# Patient Record
Sex: Female | Born: 1965 | Race: Black or African American | Hispanic: No | Marital: Married | Smoking: Never smoker
Health system: Southern US, Community
[De-identification: ages and names within clinical notes are randomized; demographics above are authoritative.]

## PROBLEM LIST (undated history)

## (undated) DIAGNOSIS — R0981 Nasal congestion: Secondary | ICD-10-CM

## (undated) DIAGNOSIS — K921 Melena: Secondary | ICD-10-CM

## (undated) DIAGNOSIS — D649 Anemia, unspecified: Secondary | ICD-10-CM

## (undated) DIAGNOSIS — R238 Other skin changes: Secondary | ICD-10-CM

## (undated) DIAGNOSIS — L272 Dermatitis due to ingested food: Secondary | ICD-10-CM

## (undated) DIAGNOSIS — T7840XA Allergy, unspecified, initial encounter: Secondary | ICD-10-CM

## (undated) DIAGNOSIS — K219 Gastro-esophageal reflux disease without esophagitis: Secondary | ICD-10-CM

## (undated) DIAGNOSIS — G43909 Migraine, unspecified, not intractable, without status migrainosus: Secondary | ICD-10-CM

## (undated) DIAGNOSIS — E7800 Pure hypercholesterolemia, unspecified: Secondary | ICD-10-CM

## (undated) DIAGNOSIS — R233 Spontaneous ecchymoses: Secondary | ICD-10-CM

## (undated) DIAGNOSIS — K625 Hemorrhage of anus and rectum: Secondary | ICD-10-CM

## (undated) DIAGNOSIS — K5900 Constipation, unspecified: Secondary | ICD-10-CM

## (undated) HISTORY — DX: Anemia, unspecified: D64.9

## (undated) HISTORY — DX: Dermatitis due to ingested food: L27.2

## (undated) HISTORY — DX: Nasal congestion: R09.81

## (undated) HISTORY — DX: Spontaneous ecchymoses: R23.3

## (undated) HISTORY — PX: EYE SURGERY: SHX253

## (undated) HISTORY — DX: Hemorrhage of anus and rectum: K62.5

## (undated) HISTORY — DX: Constipation, unspecified: K59.00

## (undated) HISTORY — DX: Migraine, unspecified, not intractable, without status migrainosus: G43.909

## (undated) HISTORY — DX: Pure hypercholesterolemia, unspecified: E78.00

## (undated) HISTORY — DX: Other skin changes: R23.8

## (undated) HISTORY — DX: Allergy, unspecified, initial encounter: T78.40XA

## (undated) HISTORY — DX: Melena: K92.1

---

## 2004-07-06 ENCOUNTER — Emergency Department (HOSPITAL_COMMUNITY): Admission: EM | Admit: 2004-07-06 | Discharge: 2004-07-06 | Payer: Self-pay | Admitting: Emergency Medicine

## 2005-12-08 ENCOUNTER — Encounter: Admission: RE | Admit: 2005-12-08 | Discharge: 2005-12-08 | Payer: Self-pay | Admitting: Obstetrics & Gynecology

## 2005-12-18 ENCOUNTER — Encounter: Admission: RE | Admit: 2005-12-18 | Discharge: 2005-12-18 | Payer: Self-pay | Admitting: Obstetrics & Gynecology

## 2006-01-02 ENCOUNTER — Encounter: Admission: RE | Admit: 2006-01-02 | Discharge: 2006-01-02 | Payer: Self-pay | Admitting: Gastroenterology

## 2006-06-25 ENCOUNTER — Encounter: Admission: RE | Admit: 2006-06-25 | Discharge: 2006-06-25 | Payer: Self-pay | Admitting: Obstetrics & Gynecology

## 2006-11-19 ENCOUNTER — Encounter: Admission: RE | Admit: 2006-11-19 | Discharge: 2006-11-19 | Payer: Self-pay

## 2007-01-24 ENCOUNTER — Encounter: Admission: RE | Admit: 2007-01-24 | Discharge: 2007-01-24 | Payer: Self-pay | Admitting: Obstetrics & Gynecology

## 2007-03-10 IMAGING — MG MM MAMMO SCREENING
4 series · 4 of 4 positions shown · non-contrast
Comparison: none

SCREENING MAMMOGRAM:
There is a fibroglandular pattern.  A possible mass is noted in the left breast.  Spot compression 
views and possibly sonography are recommended for further evaluation.  In the right breast, no 
masses or malignant type calcifications are identified.

[R CC]
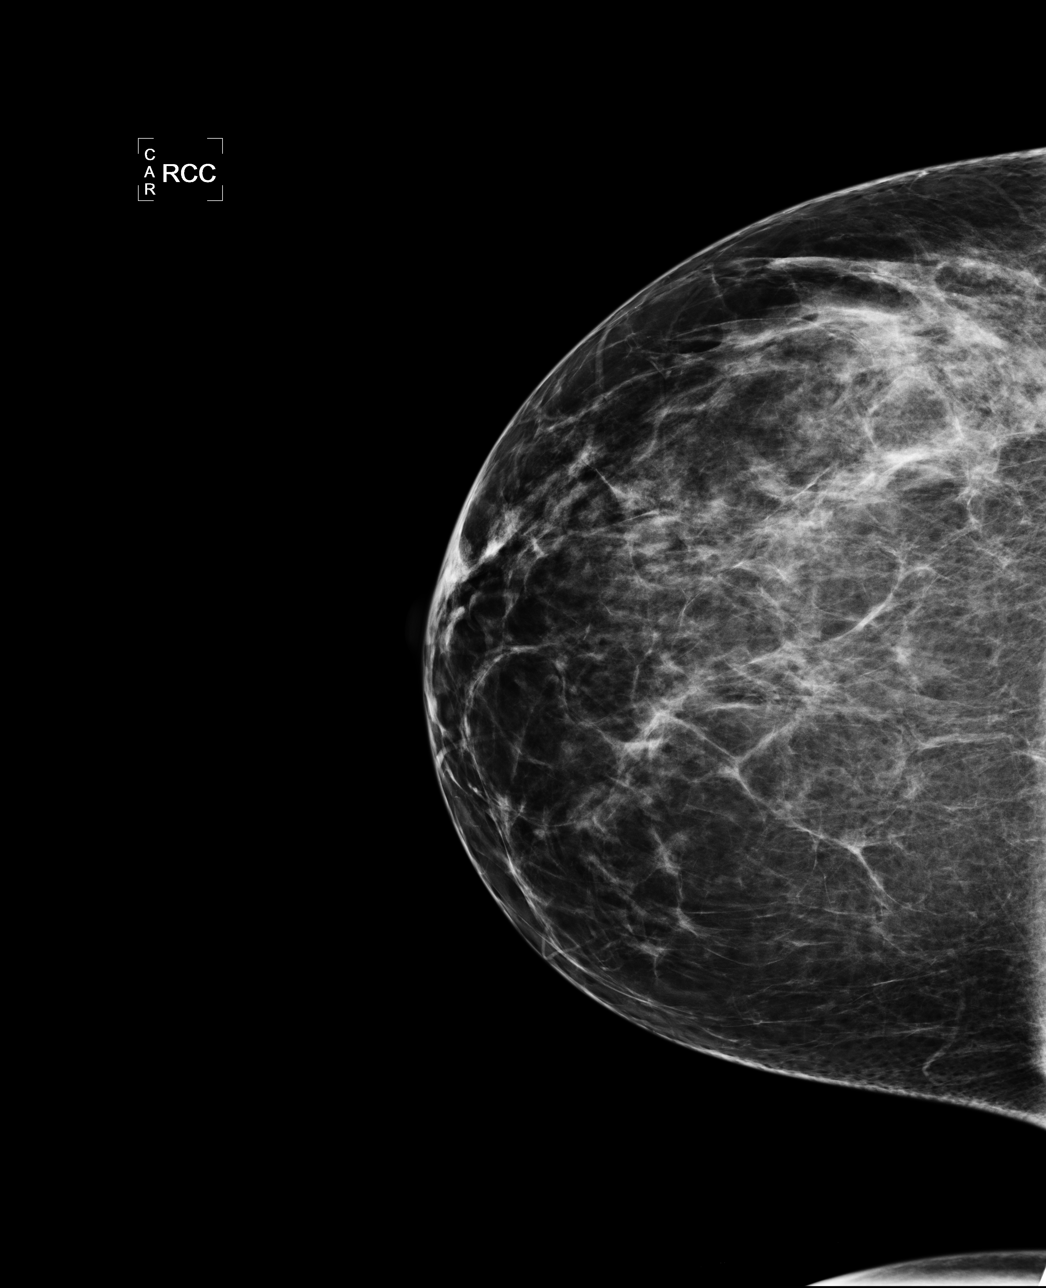

[L CC]
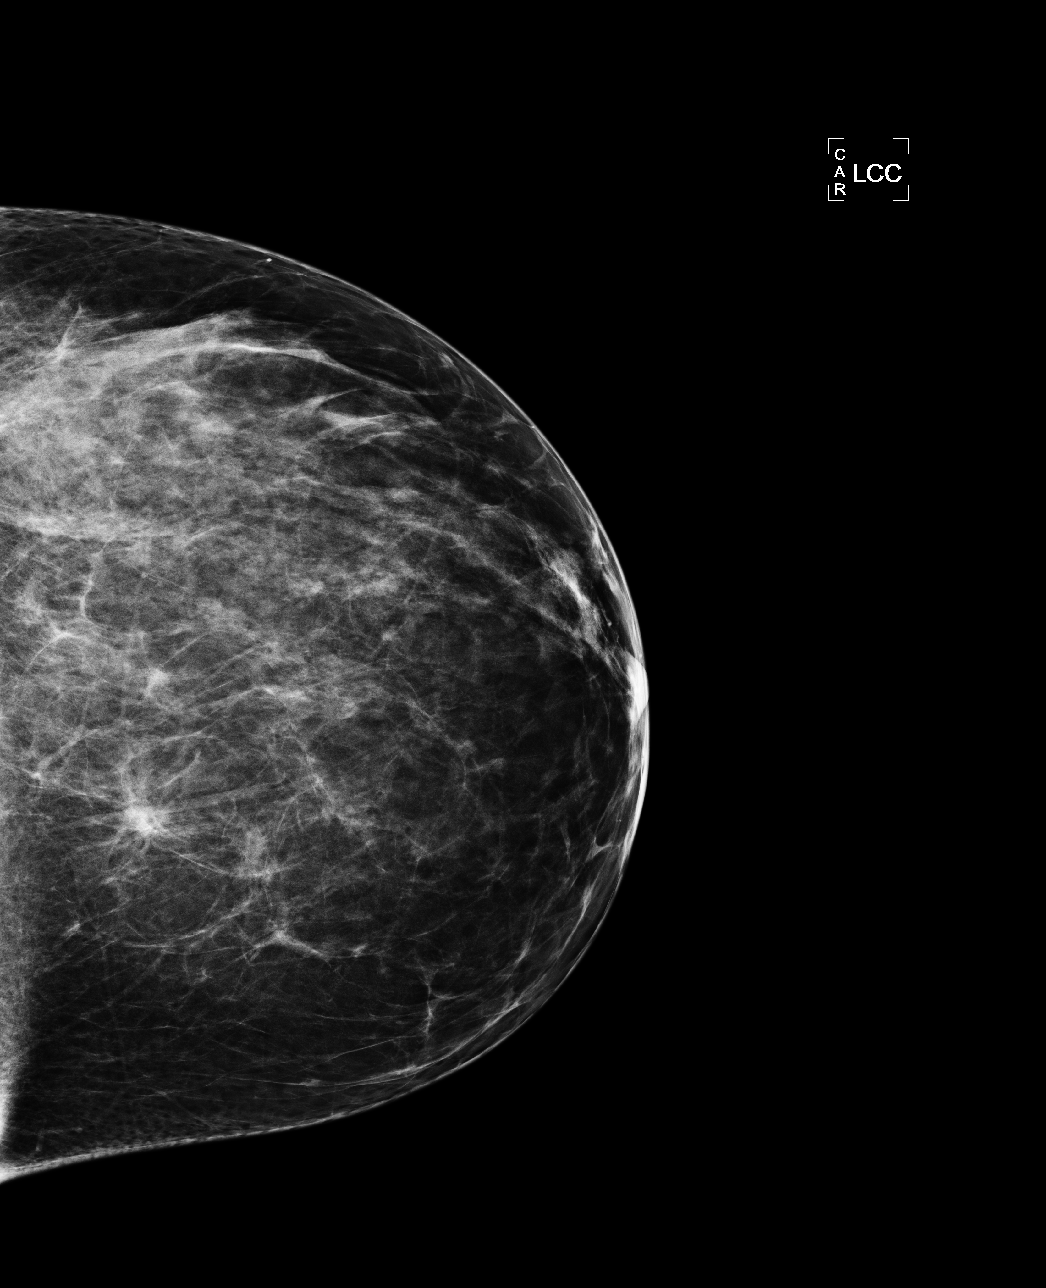

[L MLO]
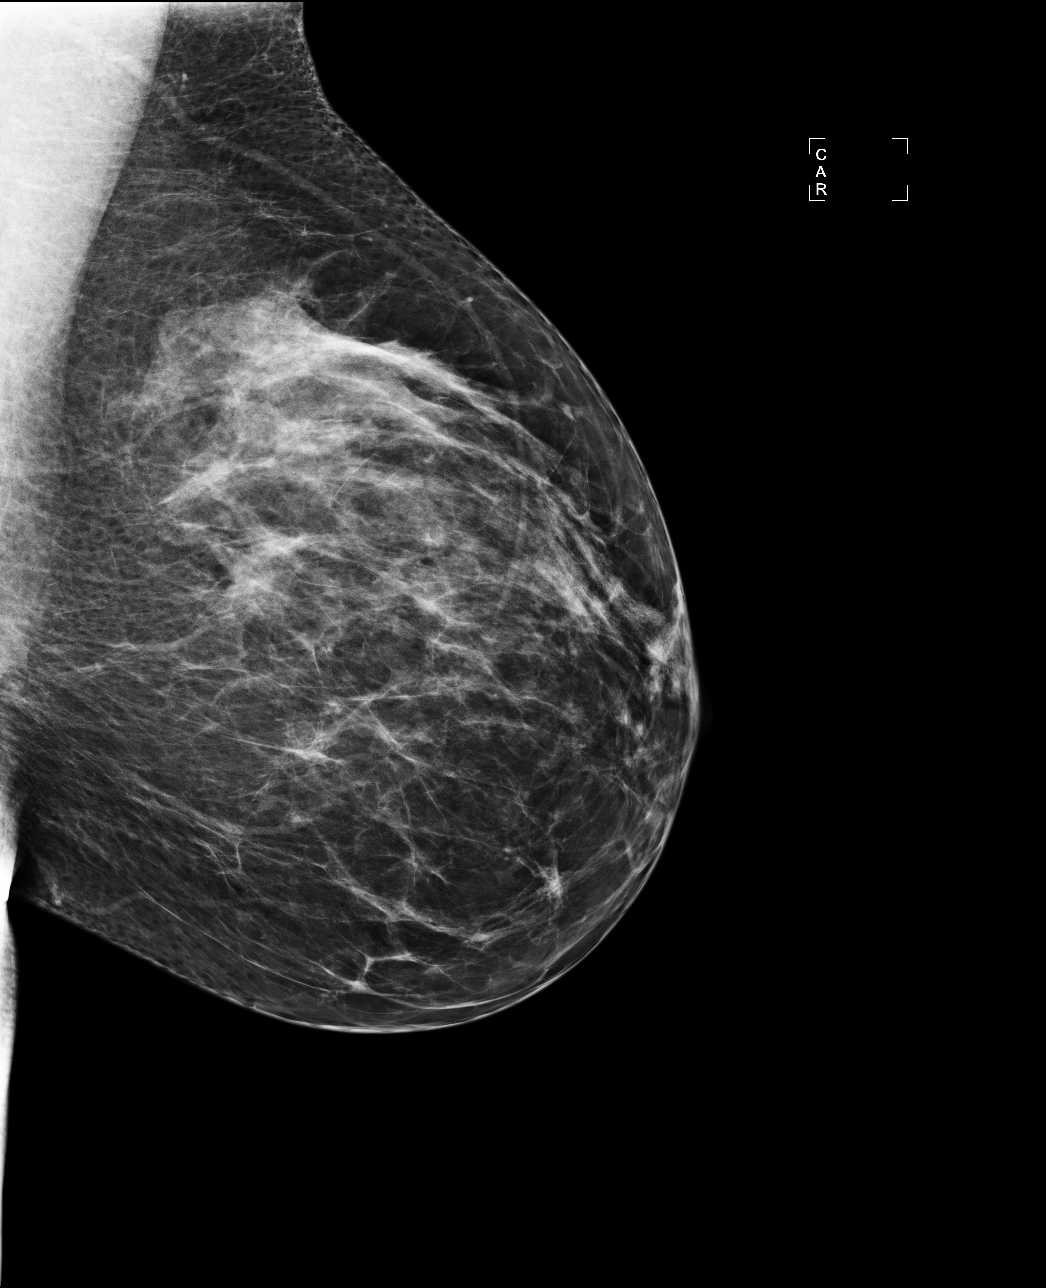

[R MLO]
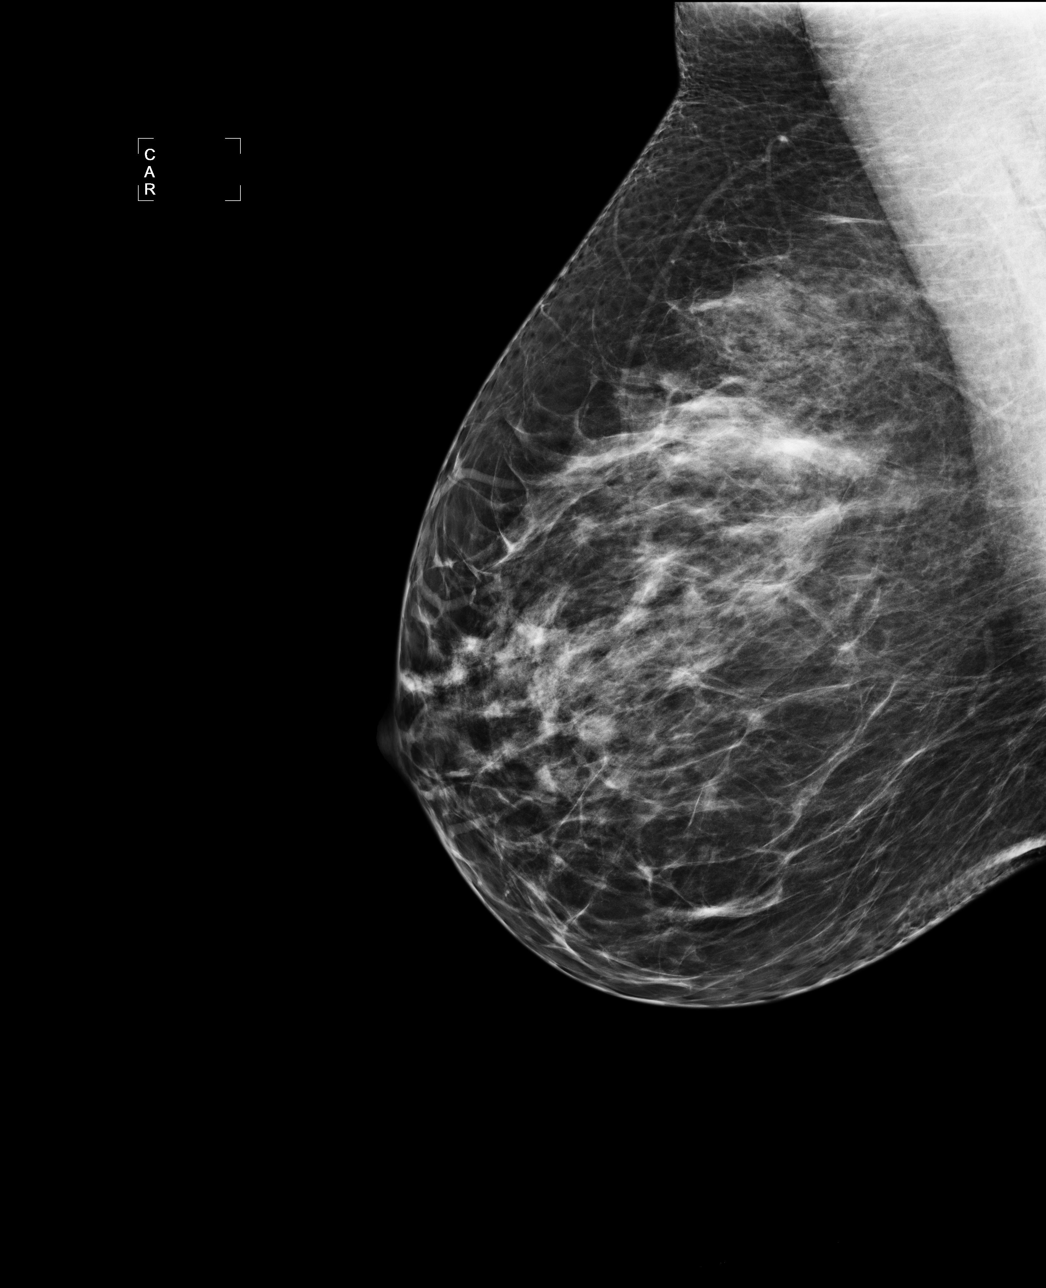

[4 of 4 positions shown; findings below may reference images not displayed]

IMPRESSION: Possible mass, left breast.  Additional evaluation is indicated.  The patient will be contacted for
additional studies and a supplementary report will follow.  No specific mammographic evidence of 
malignancy, right breast.

ASSESSMENT: Need additional imaging evaluation and/or prior mammograms for comparison - BI-RADS 0

Further imaging of the left breast.

## 2007-10-19 ENCOUNTER — Ambulatory Visit: Payer: Self-pay | Admitting: Pulmonary Disease

## 2007-10-19 LAB — CONVERTED CEMR LAB
ALT: 15 units/L (ref 0–35)
AST: 24 units/L (ref 0–37)
Albumin: 3.8 g/dL (ref 3.5–5.2)
Basophils Absolute: 0 10*3/uL (ref 0.0–0.1)
Basophils Relative: 0 % (ref 0.0–1.0)
Bilirubin Urine: NEGATIVE
Calcium: 9.1 mg/dL (ref 8.4–10.5)
Chloride: 108 meq/L (ref 96–112)
Cholesterol: 206 mg/dL (ref 0–200)
Crystals: NEGATIVE
Eosinophils Absolute: 0.2 10*3/uL (ref 0.0–0.6)
GFR calc Af Amer: 102 mL/min
GFR calc non Af Amer: 84 mL/min
HCT: 33.2 % — ABNORMAL LOW (ref 36.0–46.0)
Iron: 69 ug/dL (ref 42–145)
Ketones, ur: NEGATIVE mg/dL
Monocytes Absolute: 0.7 10*3/uL (ref 0.2–0.7)
Monocytes Relative: 7.9 % (ref 3.0–11.0)
Neutrophils Relative %: 62.5 % (ref 43.0–77.0)
Nitrite: NEGATIVE
Potassium: 3.6 meq/L (ref 3.5–5.1)
RDW: 13.9 % (ref 11.5–14.6)
Sodium: 140 meq/L (ref 135–145)
Specific Gravity, Urine: >=1.03 (ref 1.000–1.03)
Total CHOL/HDL Ratio: 4.4
Total Protein, Urine: NEGATIVE mg/dL
Triglycerides: 71 mg/dL (ref 0–149)
Urine Glucose: NEGATIVE mg/dL
VLDL: 14 mg/dL (ref 0–40)

## 2007-10-27 ENCOUNTER — Encounter: Payer: Self-pay | Admitting: Pulmonary Disease

## 2007-11-02 ENCOUNTER — Ambulatory Visit: Payer: Self-pay | Admitting: Pulmonary Disease

## 2007-11-02 LAB — CONVERTED CEMR LAB
OCCULT 1: NEGATIVE
OCCULT 2: NEGATIVE
OCCULT 3: NEGATIVE

## 2008-01-13 ENCOUNTER — Telehealth (INDEPENDENT_AMBULATORY_CARE_PROVIDER_SITE_OTHER): Payer: Self-pay | Admitting: *Deleted

## 2008-02-16 ENCOUNTER — Encounter: Admission: RE | Admit: 2008-02-16 | Discharge: 2008-02-16 | Payer: Self-pay | Admitting: Obstetrics and Gynecology

## 2008-06-01 ENCOUNTER — Ambulatory Visit: Payer: Self-pay | Admitting: Internal Medicine

## 2008-06-01 ENCOUNTER — Telehealth: Payer: Self-pay | Admitting: Adult Health

## 2008-06-01 ENCOUNTER — Ambulatory Visit: Payer: Self-pay | Admitting: Pulmonary Disease

## 2008-06-01 DIAGNOSIS — D649 Anemia, unspecified: Secondary | ICD-10-CM | POA: Insufficient documentation

## 2008-06-01 DIAGNOSIS — G43909 Migraine, unspecified, not intractable, without status migrainosus: Secondary | ICD-10-CM

## 2008-08-30 ENCOUNTER — Ambulatory Visit: Payer: Self-pay | Admitting: Pulmonary Disease

## 2008-09-17 ENCOUNTER — Ambulatory Visit: Payer: Self-pay | Admitting: Pulmonary Disease

## 2008-11-21 ENCOUNTER — Ambulatory Visit: Payer: Self-pay | Admitting: Pulmonary Disease

## 2008-11-21 DIAGNOSIS — E7800 Pure hypercholesterolemia, unspecified: Secondary | ICD-10-CM

## 2009-01-02 ENCOUNTER — Ambulatory Visit: Payer: Self-pay | Admitting: Pulmonary Disease

## 2009-01-02 LAB — CONVERTED CEMR LAB
Fecal Occult Blood: NEGATIVE
OCCULT 2: NEGATIVE
OCCULT 4: NEGATIVE
OCCULT 5: NEGATIVE

## 2009-02-20 ENCOUNTER — Encounter: Admission: RE | Admit: 2009-02-20 | Discharge: 2009-02-20 | Payer: Self-pay | Admitting: Pulmonary Disease

## 2009-04-23 HISTORY — PX: MYOMECTOMY: SHX85

## 2009-05-14 ENCOUNTER — Encounter (INDEPENDENT_AMBULATORY_CARE_PROVIDER_SITE_OTHER): Payer: Self-pay | Admitting: Obstetrics & Gynecology

## 2009-05-14 ENCOUNTER — Ambulatory Visit (HOSPITAL_COMMUNITY): Admission: RE | Admit: 2009-05-14 | Discharge: 2009-05-15 | Payer: Self-pay | Admitting: Obstetrics & Gynecology

## 2009-12-25 ENCOUNTER — Telehealth: Payer: Self-pay | Admitting: Pulmonary Disease

## 2010-01-17 ENCOUNTER — Ambulatory Visit: Payer: Self-pay | Admitting: Pulmonary Disease

## 2010-01-17 LAB — CONVERTED CEMR LAB
ALT: 29 units/L (ref 0–35)
Albumin: 3.7 g/dL (ref 3.5–5.2)
Basophils Relative: 0.3 % (ref 0.0–3.0)
Bilirubin, Direct: 0.1 mg/dL (ref 0.0–0.3)
Chloride: 105 meq/L (ref 96–112)
Glucose, Bld: 89 mg/dL (ref 70–99)
HCT: 34.7 % — ABNORMAL LOW (ref 36.0–46.0)
Ketones, ur: NEGATIVE mg/dL
Lymphocytes Relative: 21.9 % (ref 12.0–46.0)
MCHC: 32.8 g/dL (ref 30.0–36.0)
MCV: 90.5 fL (ref 78.0–100.0)
Monocytes Relative: 9.5 % (ref 3.0–12.0)
Neutrophils Relative %: 67.2 % (ref 43.0–77.0)
Platelets: 230 10*3/uL (ref 150.0–400.0)
Potassium: 4.2 meq/L (ref 3.5–5.1)
RDW: 12.7 % (ref 11.5–14.6)
Saturation Ratios: 11 % — ABNORMAL LOW (ref 20.0–50.0)
Total Bilirubin: 0.5 mg/dL (ref 0.3–1.2)
Total CHOL/HDL Ratio: 3
Total Protein, Urine: NEGATIVE mg/dL
Total Protein: 7.2 g/dL (ref 6.0–8.3)
Urine Glucose: NEGATIVE mg/dL
Urobilinogen, UA: 1 (ref 0.0–1.0)
WBC: 8.6 10*3/uL (ref 4.5–10.5)
pH: 6 (ref 5.0–8.0)

## 2010-02-24 ENCOUNTER — Encounter: Admission: RE | Admit: 2010-02-24 | Discharge: 2010-02-24 | Payer: Self-pay | Admitting: Pulmonary Disease

## 2010-12-05 DIAGNOSIS — L272 Dermatitis due to ingested food: Secondary | ICD-10-CM | POA: Insufficient documentation

## 2010-12-21 LAB — CONVERTED CEMR LAB
AST: 19 units/L (ref 0–37)
Alkaline Phosphatase: 45 units/L (ref 39–117)
BUN: 9 mg/dL (ref 6–23)
Basophils Relative: 0.7 % (ref 0.0–3.0)
Bilirubin, Direct: 0.1 mg/dL (ref 0.0–0.3)
CO2: 28 meq/L (ref 19–32)
Calcium: 9 mg/dL (ref 8.4–10.5)
Chloride: 105 meq/L (ref 96–112)
Creatinine, Ser: 0.8 mg/dL (ref 0.4–1.2)
Eosinophils Relative: 1.1 % (ref 0.0–5.0)
GFR calc non Af Amer: 84 mL/min
Glucose, Bld: 88 mg/dL (ref 70–99)
HCT: 35.4 % — ABNORMAL LOW (ref 36.0–46.0)
Hemoglobin: 11.9 g/dL — ABNORMAL LOW (ref 12.0–15.0)
Iron: 93 ug/dL (ref 42–145)
Leukocytes, UA: NEGATIVE
Lymphocytes Relative: 27.9 % (ref 12.0–46.0)
MCHC: 33.6 g/dL (ref 30.0–36.0)
MCV: 89.6 fL (ref 78.0–100.0)
RBC / HPF: NONE SEEN
RBC: 3.95 M/uL (ref 3.87–5.11)
Saturation Ratios: 21.3 % (ref 20.0–50.0)
Specific Gravity, Urine: 1.015 (ref 1.000–1.03)
Total Bilirubin: 0.7 mg/dL (ref 0.3–1.2)
Total CHOL/HDL Ratio: 4.2
Total Protein, Urine: NEGATIVE mg/dL
Total Protein: 6.9 g/dL (ref 6.0–8.3)
Urine Glucose: NEGATIVE mg/dL
Urobilinogen, UA: 0.2 (ref 0.0–1.0)
WBC: 6.7 10*3/uL (ref 4.5–10.5)
pH: 7 (ref 5.0–8.0)

## 2010-12-25 NOTE — Assessment & Plan Note (Signed)
Summary: cpx/jd   CC:  Yearly CPX....  History of Present Illness: 45 y/o WF here for follow up visit and CPX... she is [place] Cherry's daughter & Arlee Muslim (passed away 2009-04-29) granddaughter... she has had a good year overall w/ GYN surg by [facility] (uterine myomectomy)... still notes heavy menses, and is taking the Fe & VitC... she was rec to start Pravastatin last yr but she decided to "handle it on my own w/ diet"... her weight is unchanged over the past yr, but she pomises to do better & start exerc at the gym... She notes a recent URI w/ ST, hoarseness, some congestion... feeling better now but notes some eye irritation... **advised rest, fluids, mucinex, natural tears vs visine.    Current Problems:   PHYSICAL EXAMINATION (ICD-V70.0) - age 66> GYN= DrLavoie;  no GI problems noted;  didn't get the 2009-04-29 flu vaccine...  HYPERCHOLESTEROLEMIA (ICD-272.0) - on diet alone...  ~  FLP 11/08 showed TChol 206, TG 71, HDL 47, LDL 151... rec- diet therapy, may need meds.  ~  FLP 12/09 showed TChol 248, TG 115, HDL 59, LDL 163... rec to start Prav40 but she never did.  ~  FLP 2/11 showed TChol 196, TG 39, HDL 68, LDl 120... continue diet efforts.  MIGRAINE HEADACHE (ICD-346.90) - she uses OTC Tylenol and Advil as needed.  ANEMIA (ICD-285.9) - she has a hx of iron-defic anemia the result of heavy menses.Marland Kitchen. ?fibroid- [facility]... she takes FESO4 and VitC daily... she is on BCP's from [facility]ous[facility] and she tells me that she is sched for fibroid surgery next summer... s/p uterine myomectomy 6/10 by DrLavoie...  ~  labs 2/11 showed Hg= 11.4, MCV= 91, Fe= 36 (11%sat)... continue Fe & Vit C daily...   Allergies (verified): No Known Drug Allergies  Comments:  Nurse/Medical Assistant: The patient's medications and allergies were reviewed with the patient and were updated in the Medication and Allergy Lists.  Past History:  Past Medical History: HYPERCHOLESTEROLEMIA  (ICD-272.0) MIGRAINE HEADACHE (ICD-346.90) ANEMIA (ICD-285.9)  Past Surgical History: S/P uterine myomectomy 6/10 by DrLavoie  Family History: Reviewed history from 11/21/2008 and no changes required. Father died in his 59's from a stroke Mother, [place], is in her 22's w/ hx breast cancer 2 Siblings-  Brother died from suicide Sister alive w/ hx migraines  Social History: Reviewed history from 06/01/2008 and no changes required. Married, husb= [place], 71yrs. 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  The patient denies fever, chills, sweats, anorexia, fatigue, weakness, malaise, weight loss, sleep disorder, blurring, diplopia, eye discharge, vision loss, eye pain, photophobia, earache, ear discharge, tinnitus, decreased hearing, nasal congestion, nosebleeds, hoarseness, chest pain, palpitations, syncope, dyspnea on exertion, orthopnea, PND, peripheral edema, dyspnea at rest, excessive sputum, hemoptysis, wheezing, pleurisy, nausea, vomiting, diarrhea, constipation, change in bowel habits, abdominal pain, melena, hematochezia, jaundice, gas/bloating, indigestion/heartburn, dysphagia, odynophagia, dysuria, hematuria, urinary frequency, urinary hesitancy, nocturia, incontinence, back pain, joint pain, joint swelling, muscle cramps, muscle weakness, stiffness, arthritis, sciatica, restless legs, leg pain at night, leg pain with exertion, rash, itching, dryness, suspicious lesions, paralysis, paresthesias, seizures, tremors, vertigo, transient blindness, frequent falls, frequent headaches, difficulty walking, depression, anxiety, memory loss, confusion, cold intolerance, heat intolerance, polydipsia, polyphagia, polyuria, unusual weight change, abnormal bruising, bleeding, enlarged lymph nodes, urticaria, allergic rash, hay fever, and recurrent infections.    Vital  Signs:  Patient profile:   45 year old female Height:      64.5 inches Weight:      178.50 pounds BMI:     30.28 O2 Sat:      100 % on Room air Temp:     98.4 degrees F oral Pulse rate:   66 / minute BP sitting:   112 / 78  (right arm) Cuff size:   regular  Vitals Entered By: Randell Loop CMA (January 17, 2010 9:12 AM)  O2 Sat at Rest %:  100 O2 Flow:  Room air CC: Yearly CPX... Is Patient Diabetic? No Pain Assessment Patient in pain? no      Comments meds updated today   Physical Exam  Additional Exam:  WD, petite, 45 y/o BF in NAD... GENERAL:  Alert & oriented; pleasant & cooperative... HEENT:  [place]/AT, EOM-wnl, PERRLA, EACs-clear, TMs-wnl, NOSE-clear, THROAT-clear & wnl. NECK:  Supple w/ full ROM; no JVD; normal carotid impulses w/o bruits; no thyromegaly or nodules palpated; no lymphadenopathy. CHEST:  Clear to P & A; without wheezes/ rales/ or rhonchi. HEART:  Regular Rhythm; without murmurs/ rubs/ or gallops. ABDOMEN:  Soft & nontender; normal bowel sounds; no organomegaly or masses detected. EXT: without deformities or arthritic changes; no varicose veins/ venous insuffic/ or edema. NEURO:  CN's intact; motor testing normal; sensory testing normal; gait normal & balance OK. DERM:  No lesions noted; no rash etc...    EKG  Procedure date:  01/17/2010  Findings:      Normal sinus rhythm with rate of:  62/min... Tracing is WNL, NAD...  SN   MISC. Report  Procedure date:  01/17/2010  Findings:      BMP (METABOL)   Sodium                    139 mEq/L                   135-145   Potassium                 4.2 mEq/L                   3.5-5.1   Chloride                  105 mEq/L                   96-112   Carbon Dioxide            29 mEq/L                    19-32   Glucose                   89 mg/dL                    11-91   BUN                       10 mg/dL                    4-78   Creatinine                0.8 mg/dL                   2.9-5.6   Calcium  9.1 mg/dL                   5.6-21.3   GFR                       100.88 mL/min               >60  Lipid Panel (LIPID)   Cholesterol               196 mg/dL                   0-865   Triglycerides             39.0 mg/dL                  7.8-469.6   HDL                       29.52 mg/dL                 >84.13   LDL Cholesterol      [H]  244 mg/dL                   0-10  CBC Platelet w/Diff (CBCD)   White Cell Count          8.6 K/uL                    4.5-10.5   Red Cell Count       [L]  3.83 Mil/uL                 3.87-5.11   Hemoglobin           [L]  11.4 g/dL                   27.2-53.6   Hematocrit           [L]  34.7 %                      36.0-46.0   MCV                       90.5 fl                     78.0-100.0   Platelet Count            230.0 K/uL                  150.0-400.0   Neutrophil %              67.2 %                      43.0-77.0   Lymphocyte %              21.9 %                      12.0-46.0   Monocyte %                9.5 %                       3.0-12.0   Eosinophils%              1.1 %  0.0-5.0   Basophils %               0.3 %   Comments:      Hepatic/Liver Function Panel (HEPATIC)   Total Bilirubin           0.5 mg/dL                   0.8-6.5   Direct Bilirubin          0.1 mg/dL                   7.8-4.6   Alkaline Phosphatase      66 U/L                      39-117   AST                       24 U/L                      0-37   ALT                       29 U/L                      0-35   Total Protein             7.2 g/dL                    9.6-2.9   Albumin                   3.7 g/dL                    5.2-8.4  TSH (TSH)   FastTSH                   5.25 uIU/mL                 0.35-5.50  UDip w/Micro (URINE)   Color                     YELLOW   Clarity                   CLEAR                       Clear   Specific Gravity          >=1.030                     1.000 - 1.030   Urine Ph                  6.0                          5.0-8.0   Protein                   NEGATIVE                    Negative   Urine Glucose             NEGATIVE                    Negative   Ketones  NEGATIVE                    Negative   Urine Bilirubin           NEGATIVE                    Negative   Blood                     MODERATE                    Negative   Urobilinogen              1.0                         0.0 - 1.0   Leukocyte Esterace        NEGATIVE                    Negative   Nitrite                   NEGATIVE                    Negative   Urine WBC                 3-6/hpf                     0-2/hpf   Urine RBC                 3-6/hpf                     0-2/hpf   Urine Mucus               Presence of                 None   Urine Epith               Few(5-10/hpf)               Rare(0-4/hpf)   Urine Bacteria            Few(10-50/hpf)              None   IBC Panel (IBC)   Iron                 [L]  36 ug/dL                    82-956   Transferrin               233.9 mg/dL                 213.0-865.7   Iron Saturation      [L]  11.0 %                      20.0-50.0   Impression & Recommendations:  Problem # 1:  PHYSICAL EXAMINATION (ICD-V70.0) Good general health... Fasting labs drawn this AM before she was seen... Orders: EKG w/ Interpretation (93000)  Problem # 2:  HYPERCHOLESTEROLEMIA (ICD-272.0) Her numbers are improved on diet + exercise program... keep up the good work... The following medications were removed from the medication list:    Pravastatin Sodium 40 Mg Tabs (Pravastatin sodium) .Marland Kitchen... Take one tablet by mouth at bedtime  Problem # 3:  ANEMIA (ICD-285.9) She will continue the Fe + Vit C daily... Her updated medication list for this problem includes:    Slow Fe 142 (45 Fe) Mg Cr-tabs (Ferrous sulfate) .Marland Kitchen... Take 1 tablet by mouth once a day  Problem # 4:  OTHER MEDICAL PROBLEMS AS NOTED>>>  Complete Medication List: 1)  Slow Fe 142 (45 Fe) Mg Cr-tabs (Ferrous  sulfate) .... Take 1 tablet by mouth once a day 2)  Vitamin C 500 Mg Tabs (Ascorbic acid) .... Take 1 tablet by mouth once a day 3)  Vitamin D3 1000 Unit Tabs (Cholecalciferol) .... Take 1 tablet by mouth once a day  Patient Instructions: 1)  Today we updated your med list- see below.... 2)  Today we did your follow up fasting blood work... please call the "phone tree" in a few days for your lab results... you can decide at what point you might want to start medication for your Cholesterol... 3)  For your Eye symptoms: use a cool compress & try natural tears vs Visine as needed... 4)  Keep up the good work w/ diet + exercise... 5)  Call for any questions.Marland KitchenMarland Kitchen 6)  Please schedule a follow-up appointment in 1 year.   CardioPerfect ECG  ID: [id number] Patient: [name], [name] DOB: 1966/10/29 Age: 45 Years Old Sex: Female Race: Black Physician: Jatavious Peppard Technician: Randell Loop CMA Height: 64.5 Weight: 178.50 Status: Unconfirmed Past Medical History:    HYPERCHOLESTEROLEMIA (ICD-272.0) MIGRAINE HEADACHE (ICD-346.90) ANEMIA (ICD-285.9)   Recorded: 01/17/2010 09:38 AM P/PR: 98 ms / 135 ms - Heart rate (maximum exercise) QRS: 85 QT/QTc/QTd: 422 ms / 424 ms / 25 ms - Heart rate (maximum exercise)  P/QRS/T axis: 24 deg / 71 deg / 20 deg - Heart rate (maximum exercise)  Heartrate: 61 bpm  Interpretation:  Normal sinus rhythm with rate of:  62/min... Tracing is WNL, NAD...  SN

## 2010-12-25 NOTE — Progress Notes (Signed)
Summary: labs  Phone Note Call from Patient Call back at Work Phone [phone]   Caller: Patient Call For: Elgin Carn Reason for Call: Talk to Nurse Summary of Call: pt has appt on 02/25 w/SN @ 9:30.  Wants to come in earlier in the morning and have labs drawn. Initial call taken by: Eugene Gavia,  December 25, 2009 10:34 AM  Follow-up for Phone Call        please advise what labs to place. Thanks. Carron Curie CMA  December 25, 2009 10:50 AM   ok for pt to come in for labs---labs in computer and lmom to make pt aware labs are in computer for 2-25. Randell Loop CMA  December 25, 2009 2:27 PM

## 2010-12-30 ENCOUNTER — Encounter: Payer: Self-pay | Admitting: Adult Health

## 2010-12-30 ENCOUNTER — Ambulatory Visit (INDEPENDENT_AMBULATORY_CARE_PROVIDER_SITE_OTHER): Payer: BC Managed Care – PPO | Admitting: Adult Health

## 2010-12-30 DIAGNOSIS — K649 Unspecified hemorrhoids: Secondary | ICD-10-CM

## 2010-12-31 ENCOUNTER — Telehealth: Payer: Self-pay | Admitting: Adult Health

## 2011-01-08 NOTE — Assessment & Plan Note (Signed)
Summary: Acute NP office visit - hemorrhoids   CC:  hemorrhoids x2years - states bleeding has started x1year.  would like to discuss seeing an allergist for possible food allergy 1 month ago.Marland Kitchen  History of Present Illness: 45 year old AAF with known iron deficiency anemia secondary heavy menses/fibroids. Currently seen  by Dr. Cherly Hensen for gyn care and follow up of fibroids.   December 30, 2010--Prsesents for a work in visit. Complains of hemorrhoids x2years - states bleeding has started x1year.Feels hemorrhoids on outside on anus.Uses preparation H intermittent. No bloody stools.No abdominal pain. Has constipation with intermittent straining. .  Would like to discuss seeing an allergist for possible food allergy 1 month ago.Wants a referral to allergist.Is convinced she had food allergy after eating out. Had lip swelling and went to UC rx steroid taper. NO new  meds. Denies chest pain, dyspnea, orthopnea, hemoptysis, fever, n/v/d, edema, headache, abdominal pain , urinary symptoms.  Medications Prior to Update: 1)  Slow Fe 142 (45 Fe) Mg Cr-Tabs (Ferrous Sulfate) .... Take 1 Tablet By Mouth Once A Day 2)  Vitamin C 500 Mg Tabs (Ascorbic Acid) .... Take 1 Tablet By Mouth Once A Day 3)  Vitamin D3 1000 Unit Tabs (Cholecalciferol) .... Take 1 Tablet By Mouth Once A Day  Current Medications (verified): 1)  Slow Fe 142 (45 Fe) Mg Cr-Tabs (Ferrous Sulfate) .... Take 1 Tablet By Mouth Once A Day 2)  Vitamin C 500 Mg Tabs (Ascorbic Acid) .... Take 1 Tablet By Mouth Once A Day 3)  Vitamin D3 1000 Unit Tabs (Cholecalciferol) .... Take 1 Tablet By Mouth Once A Day  Allergies (verified): No Known Drug Allergies  Past History:  Past Medical History: Last updated: 02/08/2010 HYPERCHOLESTEROLEMIA (ICD-272.0) MIGRAINE HEADACHE (ICD-346.90) ANEMIA (ICD-285.9)  Past Surgical History: Last updated: 08-Feb-2010 S/P uterine myomectomy 6/10 by Otila Kluver  Family History: Last updated: Feb 08, 2010 Father  died in his 49's from a stroke Mother, [place], is in her 12's w/ hx breast cancer 2 Siblings-  Brother died from suicide Sister alive w/ hx migraines  Social History: Last updated: 02-08-2010 Married, husb= [place], 45yrs. no Public affairs consultant at [employer], [employer] Never smoker  Risk Factors: Smoking Status: never (11/21/2008)  Review of Systems      See HPI  Vital Signs:  Patient profile:   45 year old female Height:      64.5 inches Weight:      186.38 pounds BMI:     31.61 O2 Sat:      98 % on Room air Temp:     98.4 degrees F oral Pulse rate:   74 / minute BP sitting:   124 / 82  (left arm) Cuff size:   regular  Vitals Entered By: Boone Master CNA/MA (December 30, 2010 9:36 AM)  O2 Flow:  Room air CC: hemorrhoids x2years - states bleeding has started x1year.  would like to discuss seeing an allergist for possible food allergy 1 month ago. Is Patient Diabetic? No Comments Medications reviewed with patient Daytime contact number verified with patient. Boone Master CNA/MA  December 30, 2010 9:36 AM    Physical Exam  Additional Exam:  WD,  45 y/o BF in NAD... GENERAL:  Alert & oriented; pleasant & cooperative... HEENT:  [place]/AT, EOM-wnl, PERRLA, EACs-clear, TMs-wnl, NOSE-clear, THROAT-clear & wnl. NECK:  Supple w/ full ROM; no JVD; normal carotid impulses w/o bruits; no thyromegaly or nodules palpated; no lymphadenopathy. CHEST:  Clear to P & A;  without wheezes/ rales/ or rhonchi. HEART:  Regular Rhythm; without murmurs/ rubs/ or gallops. ABDOMEN:  Soft & nontender; normal bowel sounds; no organomegaly or masses detected. Rectal : several large external hemorrhoids, small internal hem, neg occult stool. no active bleeding.  EXT: without deformities or arthritic changes; no varicose veins/ venous insuffic/ or edema.     Impression & Recommendations:  Problem # 1:  HEMORRHOIDS (ICD-455.6)  Internal/external hemorrhoids, if not improving will need  referral tosurgeon  Warm sitz bath two times a day as needed  High fiber diet , add metamucil daily, activa yogurt  Colace stool softner two times a day  Increase fluids  Avoid straining. Proctocort suppostitory as needed hemorrhoids. Proctozone cream to rectal area four times a day as needed hemorrhoids Please contact office for sooner follow up if symptoms do not improve or worsen  follow up 2 weeks and as needed   Orders: Est. Patient Level III ([address])  Medications Added to Medication List This Visit: 1)  Proctozone-hc 2.5 % Crea (Hydrocortisone) .... Apply to rectal area four times a day , after bowel movement 2)  Proctocort 30 Mg Supp (Hydrocortisone acetate) .... Insert two times a day as needed hemorrhoids  Patient Instructions: 1)  Warm sitz bath two times a day as needed  2)  High fiber diet , add metamucil daily, activa yogurt  3)  Colace stool softner two times a day  4)  Increase fluids  5)  Avoid straining. 6)  Proctocort suppostitory as needed hemorrhoids. 7)  Proctozone cream to rectal area four times a day as needed hemorrhoids 8)  Please contact office for sooner follow up if symptoms do not improve or worsen  9)  follow up 2 weeks and as needed  Prescriptions: PROCTOCORT 30 MG SUPP (HYDROCORTISONE ACETATE) insert two times a day as needed hemorrhoids  #24 x 2   Entered and Authorized by:   Rubye Oaks NP   Signed by:   Rubye Oaks NP on 12/30/2010   Method used:   Electronically to        [facility] (retail)       [address]       [place], [place]  [address]       Ph: [phone]       Fax: [phone]   RxID:   [id number] PROCTOZONE-HC 2.5 % CREA (HYDROCORTISONE) Apply to rectal area four times a day , after bowel movement  #1 x 3   Entered and Authorized by:   Rubye Oaks NP   Signed by:   Rubye Oaks NP on 12/30/2010   Method used:   Electronically to        [facility] (retail)       [address]       [place], [place]  [address]       Ph: [phone]       Fax: [phone]   RxID:   [id number]    Immunization History:  Influenza Immunization History:    Influenza:  historical (08/23/2010)

## 2011-01-08 NOTE — Progress Notes (Signed)
Summary: prescription and sitz bath  Phone Note Call from Patient   Caller: Patient Call For: [name] p Summary of Call: Patient phoned stated that she was seen yesterday and the pharmacy told her that her insurance would not cover one of her prescriptions unless she got the generic she does not know the name of the prescription. She wants to know if it is ok to use generic. Also she was told to use a sitz bath but does not know where to get one. Patient can be reached at [phone] Initial call taken by: Vedia Coffer,  December 31, 2010 11:59 AM  Follow-up for Phone Call        Pt informed she can find sitz bath at [facility]. Also her cream has been called in and filled. Supp had to be transferred to [facility] on [address]. I called pharmacy and advised okay to fill generic. Pt aware. Zackery Barefoot CMA  December 31, 2010 12:26 PM

## 2011-01-14 ENCOUNTER — Ambulatory Visit: Payer: BC Managed Care – PPO | Admitting: Adult Health

## 2011-01-22 ENCOUNTER — Ambulatory Visit (INDEPENDENT_AMBULATORY_CARE_PROVIDER_SITE_OTHER): Payer: BC Managed Care – PPO | Admitting: Adult Health

## 2011-01-22 ENCOUNTER — Encounter: Payer: Self-pay | Admitting: Adult Health

## 2011-01-22 DIAGNOSIS — K649 Unspecified hemorrhoids: Secondary | ICD-10-CM

## 2011-01-27 ENCOUNTER — Other Ambulatory Visit: Payer: Self-pay | Admitting: Pulmonary Disease

## 2011-01-27 DIAGNOSIS — Z1231 Encounter for screening mammogram for malignant neoplasm of breast: Secondary | ICD-10-CM

## 2011-01-29 ENCOUNTER — Encounter: Payer: Self-pay | Admitting: Internal Medicine

## 2011-01-29 ENCOUNTER — Other Ambulatory Visit: Payer: BC Managed Care – PPO

## 2011-01-29 ENCOUNTER — Encounter (INDEPENDENT_AMBULATORY_CARE_PROVIDER_SITE_OTHER): Payer: Self-pay | Admitting: *Deleted

## 2011-01-29 ENCOUNTER — Institutional Professional Consult (permissible substitution) (INDEPENDENT_AMBULATORY_CARE_PROVIDER_SITE_OTHER): Payer: BC Managed Care – PPO | Admitting: Internal Medicine

## 2011-01-29 DIAGNOSIS — L272 Dermatitis due to ingested food: Secondary | ICD-10-CM

## 2011-01-29 NOTE — Assessment & Plan Note (Signed)
Summary: NP follow up - hemorrhoids   CC:  2 week follow up - states bleeding has improved since last ov.  History of Present Illness: 45 year old AAF with known iron deficiency anemia secondary heavy menses/fibroids. Currently seen  by Dr. Cherly Hensen for gyn care and follow up of fibroids.   December 30, 2010--Prsesents for a work in visit. Complains of hemorrhoids x2years - states bleeding has started x1year.Feels hemorrhoids on outside on anus.Uses preparation H intermittent. No bloody stools.No abdominal pain. Has constipation with intermittent straining. .  Would like to discuss seeing an allergist for possible food allergy 1 month ago.Wants a referral to allergist.Is convinced she had food allergy after eating out. Had lip swelling and went to UC rx steroid taper. NO new  meds.   January 22, 2011--Presents for 2 week follow up - states bleeding has improved since last ov. Was seen 3 weeks ago with bleeding hemorrhoids unresponsive to otc meds.Started on stools softners , Proctocol HC suppost. and cream and sitz bath.She says she is some better with less bleeding noted. Denies chest pain, dyspnea, orthopnea, hemoptysis, fever, n/v/d, edema, headache.   Medications Prior to Update: 1)  Slow Fe 142 (45 Fe) Mg Cr-Tabs (Ferrous Sulfate) .... Take 1 Tablet By Mouth Once A Day 2)  Vitamin C 500 Mg Tabs (Ascorbic Acid) .... Take 1 Tablet By Mouth Once A Day 3)  Vitamin D3 1000 Unit Tabs (Cholecalciferol) .... Take 1 Tablet By Mouth Once A Day 4)  Proctozone-Hc 2.5 % Crea (Hydrocortisone) .... Apply To Rectal Area Four Times A Day , After Bowel Movement 5)  Proctocort 30 Mg Supp (Hydrocortisone Acetate) .... Insert Two Times A Day As Needed Hemorrhoids  Current Medications (verified): 1)  Slow Fe 142 (45 Fe) Mg Cr-Tabs (Ferrous Sulfate) .... Take 1 Tablet By Mouth Once A Day 2)  Vitamin C 500 Mg Tabs (Ascorbic Acid) .... Take 1 Tablet By Mouth Once A Day 3)  Vitamin D3 1000 Unit Tabs (Cholecalciferol)  .... Take 1 Tablet By Mouth Once A Day 4)  Proctozone-Hc 2.5 % Crea (Hydrocortisone) .... Apply To Rectal Area Four Times A Day , After Bowel Movement 5)  Proctocort 30 Mg Supp (Hydrocortisone Acetate) .... Insert Two Times A Day As Needed Hemorrhoids  Allergies (verified): No Known Drug Allergies  Past History:  Past Medical History: Last updated: 2010-02-10 HYPERCHOLESTEROLEMIA (ICD-272.0) MIGRAINE HEADACHE (ICD-346.90) ANEMIA (ICD-285.9)  Past Surgical History: Last updated: Feb 10, 2010 S/P uterine myomectomy 6/10 by Otila Kluver  Family History: Last updated: 10-Feb-2010 Father died in his 76's from a stroke Mother, [place], is in her 38's w/ hx breast cancer 2 Siblings-  Brother died from suicide Sister alive w/ hx migraines  Social History: Last updated: 2010/02/10 Married, husb= [place], 75yrs. 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 No Comments Medications reviewed with patient Daytime contact number verified with patient. Boone Master CNA/MA  January 22, 2011 9:18 AM    Physical Exam  Additional Exam:  WD,  45 y/o BF in NAD... GENERAL:  Alert & oriented; pleasant & cooperative... HEENT:  [place]/AT,  EACs-clear, TMs-wnl, NOSE-clear, THROAT-clear & wnl. NECK:  Supple w/ full ROM; no JVD; normal carotid impulses w/o bruits; no thyromegaly or nodules palpated; no lymphadenopathy. CHEST:  Clear to P & A; without wheezes/  rales/ or rhonchi. HEART:  Regular Rhythm; without murmurs/ rubs/ or gallops. ABDOMEN:  Soft & nontender; normal bowel sounds; no organomegaly or masses detected. Rectal : several large external hemorrhoids,  no active bleeding.  EXT: without deformities or arthritic changes; no varicose veins/ venous insuffic/ or edema.     Impression & Recommendations:  Problem # 1:  HEMORRHOIDS (ICD-455.6) Persistent external hemorrhoids unresponsive to rx  refer to surgery for evaluation  Plan :  Warm sitz bath two times a day as needed  High fiber diet , add metamucil daily, activa yogurt  Colace stool softner two times a day  Increase fluids  Avoid straining. Proctocort suppostitory as needed hemorrhoids. Proctozone cream to rectal area four times a day as needed hemorrhoids We are referring you to surgery   Orders: Surgical Referral (Surgery) Est. Patient Level III (40981)  Patient Instructions: 1)  Warm sitz bath two times a day as needed  2)  High fiber diet , add metamucil daily, activa yogurt  3)  Colace stool softner two times a day  4)  Increase fluids  5)  Avoid straining. 6)  Proctocort suppostitory as needed hemorrhoids. 7)  Proctozone cream to rectal area four times a day as needed hemorrhoids 8)  We are referring you to surgery

## 2011-02-03 LAB — CONVERTED CEMR LAB: IgE (Immunoglobulin E), Serum: 23 intl units/mL (ref 0.0–180.0)

## 2011-02-03 NOTE — Assessment & Plan Note (Signed)
Summary: allergies per tammy parrett   Vital Signs:  Patient profile:   45 year old female Height:      64.5 inches Weight:      186 pounds BMI:     31.55 O2 Sat:      100 % on Room air Pulse rate:   78 / minute BP sitting:   118 / 72  (left arm) Cuff size:   regular  Vitals Entered By: Reynaldo Minium CMA 02-19-2011 4:05 PM)  O2 Flow:  Room air CC: Alleryg Consult   Primary Provider/Referring Provider:  Dr Kriste Basque  CC:  Alleryg Consult.  History of Present Illness: 2011-02-19- 44yoF referred by Rubye Oaks, NP concerned about food allergy. Background hx of seasonal rhinitis managed w/ otc meds. She now describes a single event on December 05, 2010. She got a carry-out meal at [facility] with a single cheese burger and caesar salad which she ate around 10PM. Next morning she woke with small blisters around her lips next morning. These were noted by appearance, but not pruritic or painful. Took benadryl. The next morning these blisters were still present and lips felt swollen. She went that day to an urgent care where she was put on prednisone for 2 days. These lesions resolved. She had no tiching, sneeze, throat or chest tightness, fever or other reaction.  she admits sensitive skin, using hypoallergenic soap- only Dove.   Preventive Screening-Counseling & Management  Alcohol-Tobacco     Smoking Status: never  Current Medications (verified): 1)  Slow Fe 142 (45 Fe) Mg Cr-Tabs (Ferrous Sulfate) .... Take 1 Tablet By Mouth Once A Day 2)  Vitamin C 500 Mg Tabs (Ascorbic Acid) .... Take 1 Tablet By Mouth Once A Day 3)  Vitamin D3 1000 Unit Tabs (Cholecalciferol) .... Take 1 Tablet By Mouth Once A Day 4)  Proctozone-Hc 2.5 % Crea (Hydrocortisone) .... 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  The patient denies shortness of breath with activity, shortness of breath at rest, productive cough, non-productive cough, coughing up blood, irregular heartbeats, acid heartburn, indigestion, loss of appetite, weight change, abdominal pain, difficulty swallowing, sore throat, tooth/dental problems, nasal congestion/difficulty breathing through nose, sneezing, itching, ear ache, anxiety, depression, hand/feet swelling, joint stiffness or pain, rash, change in color of mucus, and fever.    Physical Exam  Additional Exam:  General: A/Ox3; pleasant and cooperative, NAD, SKIN: no rash, lesions. No visible rash on face now.  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 This connection is only a guess.  Discussed food allergy vs intolerance, difficulty associating with any food/ specific food, and degrees of reaction. She can pretreat with an otc antihistamine if desired. We will send Food IgE panel.  Other Orders: T-Allergy Profile Region II-[place], [place], [place], [place], [place] [phone]) T-Food Allergy Profile Specific IgE ([id number]) Consultation Level III [phone])  Patient Instructions: 1)  Please schedule a follow-up appointment as needed. 2)  We will call the results of the allergy blood tests in a few days- call us if you don't hear by next week.  3)  You can take any antihistamine if needed  4)  Allegra 180/ fexofenadine 5)  Claritin/ loratadine 6)  Zyrtec/ cetirizine 7)  Benadryl   Orders Added: 1)  T-Allergy Profile Region II-[place], [place], [place], [place], [place] [[id number]] 2)  T-Food Allergy Profile Specific IgE [[id number]] 3)  Consultation Level III [13244]

## 2011-02-06 ENCOUNTER — Other Ambulatory Visit: Payer: BC Managed Care – PPO

## 2011-02-06 ENCOUNTER — Ambulatory Visit (INDEPENDENT_AMBULATORY_CARE_PROVIDER_SITE_OTHER): Payer: BC Managed Care – PPO | Admitting: Pulmonary Disease

## 2011-02-06 ENCOUNTER — Other Ambulatory Visit: Payer: Self-pay | Admitting: Pulmonary Disease

## 2011-02-06 ENCOUNTER — Ambulatory Visit (INDEPENDENT_AMBULATORY_CARE_PROVIDER_SITE_OTHER)
Admission: RE | Admit: 2011-02-06 | Discharge: 2011-02-06 | Disposition: A | Discharge status: Home / Self Care | Payer: BC Managed Care – PPO | Source: Ambulatory Visit | Attending: Pulmonary Disease | Admitting: Pulmonary Disease

## 2011-02-06 ENCOUNTER — Encounter: Payer: Self-pay | Admitting: Pulmonary Disease

## 2011-02-06 DIAGNOSIS — Z0000 Encounter for general adult medical examination without abnormal findings: Secondary | ICD-10-CM

## 2011-02-06 DIAGNOSIS — E319 Polyglandular dysfunction, unspecified: Secondary | ICD-10-CM

## 2011-02-06 DIAGNOSIS — E785 Hyperlipidemia, unspecified: Secondary | ICD-10-CM

## 2011-02-06 LAB — URINALYSIS, ROUTINE W REFLEX MICROSCOPIC
Ketones, ur: NEGATIVE
Leukocytes, UA: NEGATIVE
Specific Gravity, Urine: 1.025 (ref 1.000–1.030)
Total Protein, Urine: NEGATIVE
Urine Glucose: NEGATIVE
Urobilinogen, UA: 0.2 (ref 0.0–1.0)
pH: 6 (ref 5.0–8.0)

## 2011-02-06 LAB — BASIC METABOLIC PANEL
GFR: 77.59 mL/min (ref >60.00)
Potassium: 4.4 mEq/L (ref 3.5–5.1)
Sodium: 138 mEq/L (ref 135–145)

## 2011-02-06 LAB — CBC WITH DIFFERENTIAL/PLATELET
HCT: 36.9 % (ref 36.0–46.0)
Hemoglobin: 12.5 g/dL (ref 12.0–15.0)
Lymphocytes Relative: 27.2 % (ref 12.0–46.0)
MCHC: 33.8 g/dL (ref 30.0–36.0)
MCV: 90.8 fl (ref 78.0–100.0)
Platelets: 242 10*3/uL (ref 150.0–400.0)

## 2011-02-06 LAB — HEPATIC FUNCTION PANEL
Alkaline Phosphatase: 58 U/L (ref 39–117)
Bilirubin, Direct: 0.1 mg/dL (ref 0.0–0.3)
Total Bilirubin: 0.5 mg/dL (ref 0.3–1.2)
Total Protein: 7.2 g/dL (ref 6.0–8.3)

## 2011-02-06 LAB — LIPID PANEL
Cholesterol: 223 mg/dL — ABNORMAL HIGH (ref 0–200)
HDL: 54 mg/dL (ref >39.00)
Total CHOL/HDL Ratio: 4
Triglycerides: 44 mg/dL (ref 0.0–149.0)
VLDL: 8.8 mg/dL (ref 0.0–40.0)

## 2011-02-06 LAB — LDL CHOLESTEROL, DIRECT: Direct LDL: 158.3 mg/dL

## 2011-02-10 NOTE — Assessment & Plan Note (Signed)
Summary: OV PER   Primary Care Provider:  Dr Kriste Basque  CC:  Yearly ROV & CPX....  History of Present Illness: 45 y/o WF here for follow up visit and CPX... she is [place] Cherry's daughter & Arlee Muslim (passed away Apr 12, 2009) granddaughter...    ~  February 25,2011:    She has had a good year overall w/ GYN surg by [facility] (uterine myomectomy)... still notes heavy menses, and is taking the Fe & VitC... she was rec to start Pravastatin last yr but she decided to "handle it on my own w/ diet"... her weight is unchanged over the past yr, but she pomises to do better & start exerc at the gym...    She notes a recent URI w/ ST, hoarseness, some congestion... feeling better now but notes some eye irritation... **advised rest, fluids, mucinex, natural tears vs visine.   ~  February 06, 2011:    [name] has had several recent visits> TP w/ [facility], rx Hydrocort cream & improved, refer to [facility] & may need surg;  also requested allergy eval after eating a meal at [facility] one night 1/12 w/ breakout of blisters around her lips- went to [facility] w/ Pred rx + antihist & resolved; allergy eval DrYoung 3/12 w/ RAST testing> IgE 24, Regional Allergy Profile all neg & Food Profile all neg x MILK class 2...    She has been trying to control her Chol w/ diet alone> "just watching it" she says & unfortunately not exercising much, wt up 4# this yr to 183#; FLP shows TChol 223, TG 44, HDL 54, LDL 158.    She has hx anemia related to heavy menses & uterine fibroids w/ prev myomectomy Apr 12, 2009 by [facility]; she has continued bleeding & an IUD- GYN aware;  CBC shows Hg 12.5 & Fe 62 (17%sat)> continue Fe w/ VitC.    Current Problems:   PHYSICAL EXAMINATION (ICD-V70.0) - age 82> GYN= DrLavoie;  no GI problems noted;  she had the 04/12/2010 Flu vaccine...  HYPERCHOLESTEROLEMIA (ICD-272.0) - on diet alone...  ~  FLP 11/08 showed TChol 206, TG 71, HDL 47, LDL 151... rec- diet therapy, may need meds.  ~  FLP 12/09 showed TChol 248, TG 115, HDL 59,  LDL 163... rec to start Prav40 but she never did.  ~  FLP 2/11 showed TChol 196, TG 39, HDL 68, LDl 120... continue diet efforts.  MIGRAINE HEADACHE (ICD-346.90) - she uses OTC Tylenol and Advil as needed.  ANEMIA (ICD-285.9) - she has a hx of iron-defic anemia the result of heavy menses.Marland Kitchen. ?fibroid- [facility]... she takes FESO4 and VitC daily... she is on BCP's from DrCousins and she tells me that she is sched for fibroid ==> s/p uterine myomectomy 6/10 by [facility]...  ~  labs 2/11 showed Hg= 11.4, MCV= 91, Fe= 36 (11%sat)... continue Fe & Vit C daily...   Preventive Screening-Counseling & Management  Alcohol-Tobacco     Smoking Status: never  Caffeine-Diet-Exercise     Does Patient Exercise: yes     Times/week: 4  Allergies (verified): 1)  ! * ?milk Allergy  Comments:  Nurse/Medical Assistant: The patient's medications and allergies were reviewed with the patient and were updated in the Medication and Allergy Lists.  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 Father died in his 64's from a stroke Mother, [place], is in her 40's w/ hx breast cancer 2 Siblings-  Brother died from suicide Sister alive w/ hx migraines, seasonal allergy  Social History: Reviewed history from 01/17/2010 and no changes required. Married, husb= Pink, 29yrs. 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  The patient denies fever, chills, sweats, anorexia, fatigue, weakness, malaise, weight loss, sleep disorder, blurring, diplopia, eye irritation, eye discharge,  vision loss, eye pain, photophobia, earache, ear discharge, tinnitus, decreased hearing, nasal congestion, nosebleeds, sore throat, hoarseness, chest pain, palpitations, syncope, dyspnea on exertion, orthopnea, PND, peripheral edema, cough, dyspnea at rest, excessive sputum, hemoptysis, wheezing, pleurisy, nausea, vomiting, diarrhea, constipation, change in bowel habits, abdominal pain, melena, jaundice, gas/bloating, indigestion/heartburn, dysphagia, odynophagia, dysuria, hematuria, urinary frequency, urinary hesitancy, nocturia, incontinence, back pain, joint pain, joint swelling, muscle cramps, muscle weakness, stiffness, arthritis, sciatica, restless legs, leg pain at night, leg pain with exertion, rash, itching, dryness, suspicious lesions, paralysis, paresthesias, seizures, tremors, vertigo, transient blindness, frequent falls, frequent headaches, difficulty walking, depression, anxiety, memory loss, confusion, cold intolerance, heat intolerance, polydipsia, polyphagia, polyuria, unusual weight change, abnormal bruising, bleeding, enlarged lymph nodes, urticaria, allergic rash, hay fever, and recurrent infections.    Vital Signs:  Patient profile:   45 year old female Height:      6.5 inches Weight:      182.38 pounds BMI:     3045.93 O2 Sat:      100 % on Room air Temp:     98.4 degrees F oral Pulse rate:   74 / minute BP sitting:   110 / 74  (left arm) Cuff size:   regular  Vitals Entered By: Randell Loop CMA (February 06, 2011 1:11 PM)  O2 Sat at Rest %:  100 O2 Flow:  Room air CC: Yearly ROV & CPX... Is Patient Diabetic? No Pain Assessment Patient in pain? no      Comments meds updated today with pt   Physical Exam  Additional Exam:  WD, petite, 45 y/o BF in NAD... GENERAL:  Alert & oriented; pleasant & cooperative... HEENT:  [place]/AT, EOM-wnl, PERRLA, EACs-clear, TMs-wnl, NOSE-clear, THROAT-clear & wnl. NECK:  Supple w/ full ROM; no JVD; normal carotid impulses w/o bruits; no  thyromegaly or nodules palpated; no lymphadenopathy. CHEST:  Clear to P & A; without wheezes/ rales/ or rhonchi. HEART:  Regular Rhythm; without murmurs/ rubs/ or gallops. ABDOMEN:  Soft & nontender; normal bowel sounds; no organomegaly or masses detected. EXT: without deformities or arthritic changes; no varicose veins/ venous insuffic/ or edema. NEURO:  CN's intact; motor testing normal; sensory testing normal; gait normal & balance OK. DERM:  No lesions noted; no rash etc...    Impression & Recommendations:  Problem # 1:  PHYSICAL EXAMINATION (ICD-V70.0)  Orders: T-2 View CXR (71020TC) TLB-Lipid Panel (80061-LIPID) TLB-BMP (Basic Metabolic Panel-BMET) (80048-METABOL) TLB-CBC Platelet - w/Differential (85025-CBCD) TLB-Hepatic/Liver Function Pnl (80076-HEPATIC) TLB-TSH (Thyroid Stimulating Hormone) (84443-TSH) TLB-Udip ONLY (81003-UDIP) EKG w/ Interpretation (93000)  Problem # 2:  HYPERCHOLESTEROLEMIA (ICD-272.0) On diet alone but not really, and wt up 4# this yr... FLP shows [id number] & LDL 158> she really doesn't want meds but therefore needs MUCH better diet... Discussed low chol/ low fat diet...  Problem # 3:  HEMORRHOIDS (ICD-455.6) She has Hydrocort cream & she tells me she has seen  the surg at [facility] w/ plans for hem surg later this yr...  Problem # 4:  MIGRAINE HEADACHE (ICD-346.90) Denies recent HA prob... uses OTC analgesics Prn...  Problem # 5:  ANEMIA (ICD-285.9) Known fibroids and heavy menses, also has IUD> DrLavoie et al follows... Labs showed Hg  Her updated medication list for this problem includes:    Slow Fe 142 (45 Fe) Mg Cr-tabs (Ferrous sulfate) .Marland Kitchen... 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 No recurrent episodes... [place] testing DrYoung 3/12 showed neg x Class2 RAST to Milk...  Complete Medication  List: 1)  Slow Fe 142 (45 Fe) Mg Cr-tabs (Ferrous sulfate) .... Take 1 tablet by mouth once a day 2)  Vitamin C 500 Mg Tabs (Ascorbic acid) .... Take 1 tablet by mouth once a day 3)  Vitamin D3 1000 Unit Tabs (Cholecalciferol) .... Take 1 tablet by mouth once a day 4)  Hydrocortisone 2.5 % Crea (Hydrocortisone) .... Apply as directed...  Patient Instructions: 1)  Today we updated your med list- see below.... 2)  Today we did your follow up CXR, EKG, & fasting blood work... please call the "phone tree" in a few days for your lab results.Marland KitchenMarland Kitchen 3)  Try taking the Miralax daily for your constipastion.Marland KitchenMarland Kitchen 4)  Call for any problems.Marland KitchenMarland Kitchen 5)  Please schedule a follow-up appointment in 1 year. Prescriptions: HYDROCORTISONE 2.5 % CREA (HYDROCORTISONE) apply as directed...  #1 large tube x prn   Entered and Authorized by:   Michele Mcalpine MD   Signed by:   Michele Mcalpine MD on 02/06/2011   Method used:   Print then Give to Patient   RxID:   [id number]

## 2011-02-26 ENCOUNTER — Ambulatory Visit: Payer: BC Managed Care – PPO

## 2011-02-27 ENCOUNTER — Ambulatory Visit
Admission: RE | Admit: 2011-02-27 | Discharge: 2011-02-27 | Disposition: A | Discharge status: Home / Self Care | Payer: BC Managed Care – PPO | Source: Ambulatory Visit | Attending: Pulmonary Disease | Admitting: Pulmonary Disease

## 2011-02-27 DIAGNOSIS — Z1231 Encounter for screening mammogram for malignant neoplasm of breast: Secondary | ICD-10-CM

## 2011-03-02 LAB — CBC
HCT: 26 % — ABNORMAL LOW (ref 36.0–46.0)
HCT: 37 % (ref 36.0–46.0)
Hemoglobin: 12.3 g/dL (ref 12.0–15.0)
Hemoglobin: 8.8 g/dL — ABNORMAL LOW (ref 12.0–15.0)
MCHC: 33.4 g/dL (ref 30.0–36.0)
MCV: 91.8 fL (ref 78.0–100.0)
Platelets: 180 10*3/uL (ref 150–400)
RBC: 4.02 MIL/uL (ref 3.87–5.11)
WBC: 14.4 10*3/uL — ABNORMAL HIGH (ref 4.0–10.5)

## 2011-03-02 LAB — TYPE AND SCREEN: Antibody Screen: NEGATIVE

## 2011-03-02 LAB — ABO/RH: ABO/RH(D): A POS

## 2011-04-07 NOTE — Op Note (Signed)
[name], [name] NO.:  [id number]   MEDICAL RECORD NO.:  [id number]          PATIENT TYPE:  AMB   LOCATION:  DAY                          FACILITY:  [facility]   PHYSICIAN:  Genia Del, M.D.DATE OF BIRTH:  Jan 19, 1966   DATE OF PROCEDURE:  05/14/2009  DATE OF DISCHARGE:                               OPERATIVE REPORT   PREOPERATIVE DIAGNOSIS:  Uterine myoma with menorrhagia.   POSTOPERATIVE DIAGNOSIS:  Uterine myoma with menorrhagia.   PROCEDURE:  Myomectomy assisted with Da Vinci robot.   SURGEON:  Dr. Genia Del.   ASSISTANT:  Dr. Noland Fordyce and Merrilee Jansky.   PROCEDURE:  Under general anesthesia with endotracheal intubation, the  patient is in lithotomy position.  She is prepped with Betadine on the  abdominal, suprapubic, vulvar and vaginal areas and draped as usual.  The bladder is catheterized, and the Foley is left in place.  The  vaginal exam reveals an anteverted uterus about 10 cm.  No adnexal mass.  We go vaginally.  The weighted speculum is put in place.  The anterior  lip of the cervix is grasped with a tenaculum.  Hysterometry is at 11  cm.  We used a #10 Rumi with a medium KOH ring.  The Rumi is put in  place after dilatation of the cervix with Hegar dilators up to #27  without difficulty.  We remove the tenaculum.  We remove the weighted  speculum and go abdominally.  A supraumbilical incision is made with the  scalpel over 1.5 cm after infiltrating the subcutaneous tissue with  Marcaine 0.25% plain.  We open the aponeurosis under direct vision with  Mayo scissors and open the visceral peritoneum bluntly with a finger.  We put a pursestring stitch of Vicryl 0 at the aponeurosis.  The Roseanne Reno  is introduced, pneumoperitoneum is created with CO2 and the camera is  inserted at that level.  Visualization of the abdominopelvic cavity  reveals no adhesions with the anterior wall.  We therefore remove the  camera.  We mark the skin with  the surgical pen and put the trocars in a  semicircular configuration.  Each site is infiltrated with Marcaine  0.25% plain.  We then used a scalpel to make small incisions for the 8-  mm robotic trocars on the right, left and lower left side.  We make a  slightly larger incision about 1 cm over the right lower aspect for the  assistant port.  We insert all those trocars under direct vision and  then dock the robot without difficulty.  An EndoShears scissor is  inserted in the right robotic arm, the PK in the left robotic arm and  the tenaculum in the third robotic arm.  We then go to the console.  The  uterus is enlarged in volume.  A large myoma is present anteriorly which  appears intramural.  The rest of the uterus is normal in appearance  although looking slightly adenomatous.  Both tubes and both ovaries are  normal to inspection.  No other lesion is present in the pelvis or  abdomen.  The liver is well visualized as well as the appendix.  Note  that the anterior intramural myoma is about 7 cm.  We infiltrate  vasopressin on a vertical line on the anterior aspect of the uterus over  about 6 cm.  We use the point of the EndoShears scissors to do so an  used the PK to assure hemostasis as we go along.  We open the serosa and  myometrium with this vertical incision and reach the myoma.  The myoma  is very soft and multilobed, which makes it more difficult to pull and  use traction and countertraction given that it breaks off easily.  We  nonetheless succeed in extracting the myoma fully.  We control  hemostasis with the PK at the base.  We have reach the endometrial  cavity, the myoma being panmural.  We hold the myoma with the tenaculum  on the third arm and change instruments to the cutting needle driver on  the right arm and the normal needle driver on the left arm.  We use  Vicryl 0 in figure-of-8's to close the myometrium.  We then complete the  closure of the myometrium with a  running suture of Vicryl 0 and close  the serosa with a baseball stitch type of suture with Vicryl 2-0.  That  completely closes the incision with a strong closure of the myometrium  and good hemostasis.  We switch the myoma to hold it with the  laparoscopy tenaculum.  We remove all robotic instruments.  The robot is  undocked, and we proceed by laparoscopy to morcellate and removed the  uterine myoma.  The Ethicon morcellator is inserted in the location  where the assistant port.  We morcellate the myoma, remove it from the  intra-abdominal cavity and send it to pathology.  All small pieces were  carefully removed from the intra-abdominal and intrapelvic cavity.  We  irrigate and suction the abdominopelvic cavity carefully.  We then  verify hemostasis on the uterus where the myoma was removed.  We put an  Interceed to cover that incision.  Hemostasis is adequate at all levels.  We therefore remove all laparoscopy instruments.  We remove all trocars  under direct vision.  We finished with the supraumbilical trocar.  It is  removed.  We then attach the pursestring stitch on the aponeurosis using  a finger to assure that no intra-abdominal contents protrude at that  level.  We then close the aponeurosis at the assistant port with a  Vicryl 0 in a figure-of-8.  We close the skin in a subcuticular stitch  of Vicryl 4-0 at the supraumbilical and assistant port, and we use  Dermabond on all other incisions in addition to putting Dermabond on the  supraumbilical and assistant port incisions.  Hemostasis is adequate at  all levels.  The KOH ring and Rumi are removed from the vagina.  The  count of instruments is complete as well as the count of sponges.  We  have an estimated blood loss of 75 mL.  The patient received cefoxitin 1  g IV before induction.      Genia Del, M.D.  Electronically Signed     ML/MEDQ  D:  05/14/2009  T:  05/15/2009  Job:  [id number]

## 2011-12-29 ENCOUNTER — Telehealth: Payer: Self-pay | Admitting: Pulmonary Disease

## 2011-12-29 DIAGNOSIS — Z0000 Encounter for general adult medical examination without abnormal findings: Secondary | ICD-10-CM | POA: Insufficient documentation

## 2011-12-29 NOTE — Telephone Encounter (Signed)
Per SN---ok for pt to have labs prior to ov.   Labs have been placed in the computer for the pt.

## 2011-12-29 NOTE — Telephone Encounter (Signed)
Pt requesting to have lab work done prior to her CPX on 02/10/12. Please advise what labs pt will need to have done Dr. Kriste Basque, thanks

## 2011-12-29 NOTE — Telephone Encounter (Signed)
lmomtcb x1 to make pt aware 

## 2011-12-29 NOTE — Telephone Encounter (Signed)
Advised pt that labs are ready for her to complete whenever she is ready.  Pt stated nothing further needed at this time.  Antionette Fairy

## 2012-01-11 ENCOUNTER — Other Ambulatory Visit: Payer: Self-pay | Admitting: Pulmonary Disease

## 2012-01-11 ENCOUNTER — Other Ambulatory Visit (INDEPENDENT_AMBULATORY_CARE_PROVIDER_SITE_OTHER): Payer: BC Managed Care – PPO

## 2012-01-11 DIAGNOSIS — D649 Anemia, unspecified: Secondary | ICD-10-CM

## 2012-01-11 DIAGNOSIS — Z0000 Encounter for general adult medical examination without abnormal findings: Secondary | ICD-10-CM

## 2012-01-11 LAB — CBC WITH DIFFERENTIAL/PLATELET
Basophils Relative: 0.5 % (ref 0.0–3.0)
Eosinophils Relative: 0.8 % (ref 0.0–5.0)
HCT: 38 % (ref 36.0–46.0)
Hemoglobin: 12.6 g/dL (ref 12.0–15.0)
Lymphs Abs: 1.7 10*3/uL (ref 0.7–4.0)
MCV: 90.2 fl (ref 78.0–100.0)
Monocytes Absolute: 0.6 10*3/uL (ref 0.1–1.0)
Neutro Abs: 4.6 10*3/uL (ref 1.4–7.7)
Platelets: 198 10*3/uL (ref 150.0–400.0)
RBC: 4.21 Mil/uL (ref 3.87–5.11)
WBC: 7 10*3/uL (ref 4.5–10.5)

## 2012-01-11 LAB — HEPATIC FUNCTION PANEL
AST: 18 U/L (ref 0–37)
Total Bilirubin: 0.3 mg/dL (ref 0.3–1.2)

## 2012-01-11 LAB — LIPID PANEL
HDL: 55.4 mg/dL (ref >39.00)
VLDL: 9.2 mg/dL (ref 0.0–40.0)

## 2012-01-11 LAB — BASIC METABOLIC PANEL
BUN: 10 mg/dL (ref 6–23)
GFR: 88.38 mL/min (ref >60.00)
Potassium: 4 mEq/L (ref 3.5–5.1)

## 2012-01-11 LAB — LDL CHOLESTEROL, DIRECT: Direct LDL: 143.3 mg/dL

## 2012-01-29 ENCOUNTER — Other Ambulatory Visit: Payer: Self-pay | Admitting: Pulmonary Disease

## 2012-01-29 DIAGNOSIS — Z1231 Encounter for screening mammogram for malignant neoplasm of breast: Secondary | ICD-10-CM

## 2012-02-10 ENCOUNTER — Ambulatory Visit (INDEPENDENT_AMBULATORY_CARE_PROVIDER_SITE_OTHER): Payer: BC Managed Care – PPO | Admitting: Pulmonary Disease

## 2012-02-10 ENCOUNTER — Encounter: Payer: Self-pay | Admitting: Pulmonary Disease

## 2012-02-10 VITALS — BP 128/84 | HR 70 | Temp 98.2°F | Ht 64.5 in | Wt 181.4 lb

## 2012-02-10 DIAGNOSIS — E7800 Pure hypercholesterolemia, unspecified: Secondary | ICD-10-CM

## 2012-02-10 DIAGNOSIS — Z0000 Encounter for general adult medical examination without abnormal findings: Secondary | ICD-10-CM

## 2012-02-10 DIAGNOSIS — G43909 Migraine, unspecified, not intractable, without status migrainosus: Secondary | ICD-10-CM

## 2012-02-10 DIAGNOSIS — D649 Anemia, unspecified: Secondary | ICD-10-CM

## 2012-02-10 DIAGNOSIS — K649 Unspecified hemorrhoids: Secondary | ICD-10-CM

## 2012-02-10 MED ORDER — FERROUS SULFATE ER 142 (45 FE) MG PO TBCR: 1.0000 | EXTENDED_RELEASE_TABLET | Freq: Every day | ORAL | Status: DC

## 2012-02-10 NOTE — Progress Notes (Deleted)
Subjective:     Patient ID: [name], female   DOB: Jan 21, 1966, 46 y.o.   MRN: [id number]  HPI   Review of Systems     Objective:   Physical Exam     Assessment:     ***    Plan:     ***

## 2012-02-10 NOTE — Patient Instructions (Signed)
Today we updated your med list in our EPIC system...    Continue your current medications the same...  OK to use BENEDRYL 25mg  every 4-6H as needed for allergic reactions...    And try adding PEPCID 20mg  twice daily as well...  Call for any questions...  Let's plan another physical in 1 years time.Marland KitchenMarland Kitchen

## 2012-02-10 NOTE — Progress Notes (Signed)
Subjective:     Patient ID: [name], female   DOB: Apr 21, 1966, 46 y.o.   MRN: [id number]  HPI 46 y/o BF here for follow up visit and CPX... she is [place] Cherry's daughter & Arlee Muslim (passed away May 18, 2009) granddaughter...   ~  February 25,2011:  She has had a good year overall w/ GYN surg by [facility] (uterine myomectomy)... still notes heavy menses, and is taking the Fe & VitC... she was rec to start Pravastatin last yr but she decided to "handle it on my own w/ diet"... her weight is unchanged over the past yr, but she pomises to do better & start exerc at the gym...  She notes a recent URI w/ ST, hoarseness, some congestion... feeling better now but notes some eye irritation... advised rest, fluids, mucinex, natural tears vs visine.  ~  February 06, 2011:  [name] has had several recent visits> TP w/ [facility], rx Hydrocort cream & improved, refer to [facility] (DrWeatherly) & may need surg;  also requested allergy eval after eating a meal at [facility] one night 1/12 w/ breakout of blisters around her lips- went to [facility] w/ Pred rx + antihist & resolved; allergy eval DrYoung 3/12 w/ RAST testing> IgE=24, Regional Allergy Profile all neg & Food Profile all neg x MILK class 2...    She has been trying to control her Chol w/ diet alone> "just watching it" she says & unfortunately not exercising much, wt up 4# this yr to 183#; FLP shows TChol 223, TG 44, HDL 54, LDL 158.    She has hx anemia related to heavy menses & uterine fibroids w/ prev myomectomy 18-May-2009 by [facility]; she has continued bleeding & an IUD- GYN aware;  CBC shows Hg 12.5 & Fe 62 (17%sat)> continue Fe w/ VitC.  ~  February 10, 2012:  Yearly ROV & [name] reports another (only her second) allergic reaction> very similar to 2011-05-19 w/ some perioral swelling & lips breaking out w/ blisters she says; didn't come for [place] to see it (?could it be HSV1?) & treated self w/ Benedryl & improved over several days; she indicated that she may seek allergy second opinion & I  gave her copies of last yrs RAST test results;  rec to use Benedryl every 4-6H & add Pepcid 20mg  Bid...    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    Chol> she has resisted rec for meds; prefers diet & exercise but wt unchanged, & we reviewed recs; FLP showed TChol 203, TG 46, HDL 55, LDL 143    Overweight> wt is about the same 181# & she admits difficulty w/ diet & exercise due to time contraints at work...    Hemorrhoids> on AnusolHC cream as needed; she saw DrWeatherly last yr & he rec surg, she is holding off but may try to sched for this summer.    Hx Migraines> states these are stable w/ OTC analgesics as needed...    Hx Anemia> this was due to heavy menses & hx fibroids; surg DrLavoie & improved; Hg=12.6, Fe=65 (21%sat); rec to continue FeSO4... LABS 3/13:  FLP- not at goal w/ LDL 143;  Chems- wnl;  CBC- ok w/ Hg=12.6;  TSH=0.94;  Fe=65 (21%sat)   PROBLEM LIST:    PHYSICAL EXAMINATION (ICD-V70.0) - age 31> GYN= DrLavoie;  no GI problems noted;  she had the 05/18/10 Flu vaccine...  HYPERCHOLESTEROLEMIA (ICD-272.0) - on diet alone... ~  FLP  11/08 showed TChol 206, TG 71, HDL 47, LDL 151... rec- diet therapy, may need meds. ~  FLP 12/09 showed TChol 248, TG 115, HDL 59, LDL 163... rec to start Prav40 but she never did. ~  FLP 2/11 showed TChol 196, TG 39, HDL 68, LDl 120... continue diet efforts.  MIGRAINE HEADACHE (ICD-346.90) - she uses OTC Tylenol and Advil as needed.  ANEMIA (ICD-285.9) - she has a hx of iron-defic anemia the result of heavy menses.Marland Kitchen. ?fibroid- [facility]... she takes FESO4 and VitC daily... she is on BCP's from DrCousins and she tells me that she is sched for fibroid ==> s/p uterine myomectomy 6/10 by [facility]... ~  labs 2/11 showed Hg= 11.4, MCV= 91, Fe= 36 (11%sat)... continue Fe & Vit C daily...   Past Surgical History  Procedure Date  . Myomectomy 04/2009    Dr. Seymour Bars     Outpatient Encounter Prescriptions as of 02/10/2012  Medication Sig Dispense Refill  . Ascorbic Acid (VITAMIN C) 500 MG CAPS Take 1 capsule by mouth daily.      . cholecalciferol (VITAMIN D) 1000 UNITS tablet Take 1,000 Units by mouth daily.      . Ferrous Sulfate (SLOW FE) 142 (45 FE) MG TBCR Take 1 tablet by mouth daily.      . hydrocortisone 2.5 % cream Apply 1 application topically 2 (two) times daily as needed.        Not on File   Current Medications, Allergies, Past Medical History, Past Surgical History, Family History, and Social History were reviewed in [employer] record.   Review of Systems        The patient complains of hematochezia.  The patient denies fever, chills, sweats, anorexia, fatigue, weakness, malaise, weight loss, sleep disorder, blurring, diplopia, eye irritation, eye discharge, vision loss, eye pain, photophobia, earache, ear discharge, tinnitus, decreased hearing, nasal congestion, nosebleeds, sore throat, hoarseness, chest pain, palpitations, syncope, dyspnea on exertion, orthopnea, PND, peripheral edema, cough, dyspnea at rest, excessive sputum, hemoptysis, wheezing, pleurisy, nausea, vomiting, diarrhea, constipation, change in bowel habits, abdominal pain, melena, jaundice, gas/bloating, indigestion/heartburn, dysphagia, odynophagia, dysuria, hematuria, urinary frequency, urinary hesitancy, nocturia, incontinence, back pain, joint pain, joint swelling, muscle cramps, muscle weakness, stiffness, arthritis, sciatica, restless legs, leg pain at night, leg pain with exertion, rash, itching, dryness, suspicious lesions, paralysis, paresthesias, seizures, tremors, vertigo, transient blindness, frequent falls, frequent headaches, difficulty walking, depression, anxiety, memory loss, confusion, cold intolerance, heat intolerance, polydipsia, polyphagia, polyuria, unusual weight change, abnormal bruising, bleeding, enlarged lymph nodes, urticaria,  allergic rash, hay fever, and recurrent infections.     Objective:   Physical Exam    WD, petite, 45 y/o BF in NAD... GENERAL:  Alert & oriented; pleasant & cooperative... HEENT:  [place]/AT, EOM-wnl, PERRLA, EACs-clear, TMs-wnl, NOSE-clear, THROAT-clear & wnl. NECK:  Supple w/ full ROM; no JVD; normal carotid impulses w/o bruits; no thyromegaly or nodules palpated; no lymphadenopathy. CHEST:  Clear to P & A; without wheezes/ rales/ or rhonchi. HEART:  Regular Rhythm; without murmurs/ rubs/ or gallops. ABDOMEN:  Soft & nontender; normal bowel sounds; no organomegaly or masses detected. EXT: without deformities or arthritic changes; no varicose veins/ venous insuffic/ or edema. NEURO:  CN's intact; motor testing normal; sensory testing normal; gait normal & balance OK. DERM:  No lesions noted; no rash etc...  RADIOLOGY DATA:  Reviewed in the EPIC EMR & discussed w/ the patient...    >>CXR 3/12 showed normal heart size, clear lungs, NAD...    >>  EKG 3/12 showed NSR, rate72, WNL, NAD...  LABORATORY DATA:  Reviewed in the EPIC EMR & discussed w/ the patient...    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  Chol>  She declines meds rx still & prefers diet + exercise; discussed need to lose weight...  Overweight>  As noted we reviewed diet & exercise prescriptions...  Hemorrhoids>  She uses ANUSOL HC Cream as needed; she will contact DrWeatherly re surgery this summer...  Hx Migraines>  She notes this has been stable despite all her stress at work etc...  Hx Anemia>  Improved & stable w/ Hg=12.6; rec to continue Ferrosequels daily...     Plan:     Patient's  Medications  New Prescriptions   No medications on file  Previous Medications   ASCORBIC ACID (VITAMIN C) 500 MG CAPS    Take 1 capsule by mouth daily.   CHOLECALCIFEROL (VITAMIN D) 1000 UNITS TABLET    Take 1,000 Units by mouth daily.   HYDROCORTISONE 2.5 % CREAM    Apply 1 application topically 2 (two) times daily as needed.  Modified Medications   Modified Medication Previous Medication   FERROUS SULFATE (SLOW FE) 142 (45 FE) MG TBCR Ferrous Sulfate (SLOW FE) 142 (45 FE) MG TBCR      Take 1 tablet by mouth daily.    Take 1 tablet by mouth daily.  Discontinued Medications   No medications on file

## 2012-02-15 ENCOUNTER — Telehealth: Payer: Self-pay | Admitting: Pulmonary Disease

## 2012-02-15 NOTE — Telephone Encounter (Signed)
According to epic this was sent in on 02/10/12. I spoke with [facility] and they stated they did not receive the rx. I have called this into the pharmacy and pt is aware. Nothing further was needed

## 2012-02-16 ENCOUNTER — Telehealth: Payer: Self-pay | Admitting: Pulmonary Disease

## 2012-02-16 NOTE — Telephone Encounter (Signed)
LMTCB

## 2012-02-16 NOTE — Telephone Encounter (Signed)
Pt says the pharmacy questioned the dosage for ferrous sulfate and wanted her to call to confirm correct dosage before they filled this for her. She was also told they may have to order this dosage because it is not ordered much. SN, pls advise on iron dosage pt is to take or if this needs to be changed. Pt was RX'd ferrous sulfate 142mg .

## 2012-02-17 MED ORDER — FERROUS SULFATE 325 (65 FE) MG PO TABS: 325.0000 mg | ORAL_TABLET | Freq: Every day | ORAL | Status: AC

## 2012-02-17 NOTE — Telephone Encounter (Signed)
Per SN----get the iron otc  Slow iron, or other well tolerated otc iron brand.  Have her check with the pharmacy.  If she needs it for the flex spending rx for ferrous sulfate 325   1 daily.  thanks

## 2012-02-17 NOTE — Telephone Encounter (Signed)
Called, spoke with pt.  She states this is for her flex spending.  Therefore, advised SN recs she do ferrous sulfate 325 1 daily.  Rx will be needed -- this was sent to [facility].  Pt verbalized understanding of this and voiced no further questions/concerns at this time.  She will call back if anything further needed.

## 2012-02-29 ENCOUNTER — Ambulatory Visit
Admission: RE | Admit: 2012-02-29 | Discharge: 2012-02-29 | Disposition: A | Discharge status: Home / Self Care | Payer: BC Managed Care – PPO | Source: Ambulatory Visit | Attending: Pulmonary Disease | Admitting: Pulmonary Disease

## 2012-02-29 DIAGNOSIS — Z1231 Encounter for screening mammogram for malignant neoplasm of breast: Secondary | ICD-10-CM

## 2012-03-02 ENCOUNTER — Other Ambulatory Visit: Payer: Self-pay | Admitting: Pulmonary Disease

## 2012-03-02 DIAGNOSIS — R928 Other abnormal and inconclusive findings on diagnostic imaging of breast: Secondary | ICD-10-CM

## 2012-03-09 ENCOUNTER — Ambulatory Visit
Admission: RE | Admit: 2012-03-09 | Discharge: 2012-03-09 | Disposition: A | Discharge status: Home / Self Care | Payer: BC Managed Care – PPO | Source: Ambulatory Visit | Attending: Pulmonary Disease | Admitting: Pulmonary Disease

## 2012-03-09 DIAGNOSIS — R928 Other abnormal and inconclusive findings on diagnostic imaging of breast: Secondary | ICD-10-CM

## 2012-05-11 ENCOUNTER — Telehealth: Payer: Self-pay | Admitting: Pulmonary Disease

## 2012-05-11 DIAGNOSIS — K649 Unspecified hemorrhoids: Secondary | ICD-10-CM

## 2012-05-11 NOTE — Telephone Encounter (Signed)
Order has been placed and we will contact pt once this appt has been scheduled.  Called and spoke with pt and she is aware. She will be out of town from Friday--6-21  Through  Thursday 6-27.

## 2012-05-11 NOTE — Telephone Encounter (Signed)
Spoke with pt. She states that she is needing another referral to general surgery for hemorrhoid surgery. She states that SN had referred to Dr Zachery Dakins before, but he has now retired. Please advise thanks

## 2012-06-13 ENCOUNTER — Ambulatory Visit (INDEPENDENT_AMBULATORY_CARE_PROVIDER_SITE_OTHER): Payer: BC Managed Care – PPO | Admitting: General Surgery

## 2012-06-13 ENCOUNTER — Encounter (INDEPENDENT_AMBULATORY_CARE_PROVIDER_SITE_OTHER): Payer: Self-pay | Admitting: General Surgery

## 2012-06-13 ENCOUNTER — Telehealth (INDEPENDENT_AMBULATORY_CARE_PROVIDER_SITE_OTHER): Payer: Self-pay | Admitting: General Surgery

## 2012-06-13 VITALS — BP 130/76 | HR 74 | Temp 97.8°F | Resp 16 | Ht 63.0 in | Wt 183.0 lb

## 2012-06-13 DIAGNOSIS — K648 Other hemorrhoids: Secondary | ICD-10-CM

## 2012-06-13 DIAGNOSIS — K644 Residual hemorrhoidal skin tags: Secondary | ICD-10-CM

## 2012-06-13 NOTE — Telephone Encounter (Signed)
Returned patient call to advise post op appointment on 07/11/12 at 4:45. Patient agreed.

## 2012-06-13 NOTE — Patient Instructions (Signed)
You have circumferential internal and  circumferential external hemorrhoids.  We have discussed options for management, and you have decided that you are ready to go ahead with a formal internal and external hemorrhoidectomy.  We will schedule the surgery for you at your convenience. We will plan to keep you in the hospital one night, but if you do very well it is possible you may be able to go home the same day.  You should plan to be out of work for 7 days following the surgery.   Hemorrhoidectomy Hemorrhoidectomy is surgery to remove hemorrhoids. Hemorrhoids are veins that have become swollen in the rectum. The rectum is the area from the bottom end of the intestines to the opening where bowel movements leave the body. Hemorrhoids can be uncomfortable. They can cause itching, bleeding and pain if a blood clot forms in them (thrombose). If hemorrhoids are small, surgery may not be needed. But if they cover a larger area, surgery is usually suggested.  LET YOUR CAREGIVER KNOW ABOUT:   Any allergies.   All medications you are taking, including:   Herbs, eyedrops, over-the-counter medications and creams.   Blood thinners (anticoagulants), aspirin or other drugs that could affect blood clotting.   Use of steroids (by mouth or as creams).   Previous problems with anesthetics, including local anesthetics.   Possibility of pregnancy, if this applies.   Any history of blood clots.   Any history of bleeding or other blood problems.   Previous surgery.   Smoking history.   Other health problems.  RISKS AND COMPLICATIONS All surgery carries some risk. However, hemorrhoid surgery usually goes smoothly. Possible complications could include:  Urinary retention.   Bleeding.   Infection.   A painful incision.   A reaction to the anesthesia (this is not common).  BEFORE THE PROCEDURE   Stop using aspirin and non-steroidal anti-inflammatory drugs (NSAIDs) for pain relief. This  includes prescription drugs and over-the-counter drugs such as ibuprofen and naproxen. Also stop taking vitamin E. If possible, do this two weeks before your surgery.   If you take blood-thinners, ask your healthcare provider when you should stop taking them.   You will probably have blood and urine tests done several days before your surgery.   Do not eat or drink for about 8 hours before the surgery.   Arrive at least an hour before the surgery, or whenever your surgeon recommends. This will give you time to check in and fill out any needed paperwork.   Hemorrhoidectomy is often an outpatient procedure. This means you will be able to go home the same day. Sometimes, though, people stay overnight in the hospital after the procedure. Ask your surgeon what to expect. Either way, make arrangements in advance for someone to drive you home.  PROCEDURE   The preparation:   You will change into a hospital gown.   You will be given an IV. A needle will be inserted in your arm. Medication can flow directly into your body through this needle.   You might be given an enema to clear your rectum.   Once in the operating room, you will probably lie on your side or be repositioned later to lying on your stomach.   You will be given anesthesia (medication) so you will not feel anything during the surgery. The surgery often is done with local anesthesia (the area near the hemorrhoids will be numb and you will be drowsy but awake). Sometimes, general anesthesia is used (you  will be asleep during the procedure).   The procedure:   There are a few different procedures for hemorrhoids. Be sure to ask you surgeon about the procedure, the risks and benefits.   Be sure to ask about what you need to do to take care of the wound, if there is one.  AFTER THE PROCEDURE  You will stay in a recovery area until the anesthesia has worn off. Your blood pressure and pulse will be checked every so often.   You may  feel a lot of pain in the area of the rectum.   Take all pain medication prescribed by your surgeon. Ask before taking any over-the-counter pain medicines.   Sometimes sitting in a warm bath can help relieve your pain.   To make sure you have bowel movements without straining:   You will probably need to take stool softeners (usually a pill) for a few days.   You should drink 8 to 10 glasses of water each day.   Your activity will be restricted for awhile. Ask your caregiver for a list of what you should and should not do while you recover.  Document Released: 09/06/2009 Document Revised: 10/29/2011 Document Reviewed: 09/06/2009 [facility] Patient Information 2012 [place], [place].

## 2012-06-13 NOTE — Progress Notes (Signed)
Patient ID: [name], female   DOB: 1966/06/26, 46 y.o.   MRN: [id number]  Chief Complaint  Patient presents with  . Hemorrhoids    HPI [name] is a 46 y.o. female.  She is referred back over to [place] by Dr. Kriste Basque for reconsideration of surgical options for her hemorrhoids.  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 She saw Thornton Dales on 01/30/2011. He noted sizable xternal hemorrhoidal tags and internal hemorrhoids. They talked about banding as well as hemorrhoidectomy. He was leaning toward formal hemorrhoidectomy. She was too. 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  She now returns stating that she continues to be symptomatic and wants to consider surgical management. She feels the external hemorrhoids constantly. She has pain after a bowel movement and has taken a shower or bath after that. This happens almost daily. She sees blood with bowel movements, this is usually low volume of bright red. This sometimes occurs daily but always once or twice a week. She's never had any surgical intervention.  She has never had a colonoscopy. Her bowel movement are regular. There is no family history of colon cancer. She has had a laparoscopic myomectomy in the past. She has never been pregnant. She is otherwise healthy. HPI  Past Medical History  Diagnosis Date  . Hypercholesteremia   . Hemorrhoids   . Migraine headache   . Anemia   . 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 Nasal congestion   . Anal bleeding   . Blood in stool   . Easy bruising     Past Surgical History  Procedure Date  . Myomectomy 04/2009    Dr. Seymour Bars    Family History  Problem Relation Age of Onset  . Heart attack Father   . Cancer Mother     breast    Social History History  Substance Use Topics  . Smoking status: Never Smoker   . Smokeless tobacco: Never Used  . Alcohol Use: Yes     socially -  sometimes    No Known Allergies  Current Outpatient Prescriptions  Medication Sig Dispense Refill  . Ascorbic Acid (VITAMIN C) 500 MG CAPS Take 1 capsule by mouth daily.      . cholecalciferol (VITAMIN D) 1000 UNITS tablet Take 1,000 Units by mouth daily.      . ferrous sulfate 325 (65 FE) MG tablet Take 1 tablet (325 mg total) by mouth daily.  30 tablet  11  . hydrocortisone 2.5 % cream Apply 1 application topically 2 (two) times daily as needed.        Review of Systems Review of Systems  Constitutional: Negative for fever, chills and unexpected weight change.  HENT: Negative for hearing loss, congestion, sore throat, trouble swallowing and voice change.   Eyes: Negative for visual disturbance.  Respiratory: Negative for cough and wheezing.   Cardiovascular: Negative for chest pain, palpitations and leg swelling.  Gastrointestinal: Positive for anal bleeding and rectal pain. Negative for nausea, vomiting, abdominal pain, diarrhea, constipation, blood in stool and abdominal distention.  Genitourinary: Negative for hematuria, vaginal bleeding and difficulty urinating.  Musculoskeletal: Negative for arthralgias.  Skin: Negative for rash and wound.  Neurological: Negative for seizures, syncope and headaches.  Hematological: Negative for adenopathy. Does not bruise/bleed easily.  Psychiatric/Behavioral: Negative for confusion.    Blood pressure 130/76, pulse 74, temperature 97.8 F (36.6 C),  temperature source Temporal, resp. rate 16, height 5\' 3"  (1.6 m), weight 183 lb (83.008 kg).  Physical Exam Physical Exam  Constitutional: She is oriented to person, place, and time. She appears well-developed and well-nourished. No distress.  HENT:  Head: Normocephalic and atraumatic.  Nose: Nose normal.  Mouth/Throat: No oropharyngeal exudate.  Eyes: Conjunctivae and EOM are normal. Pupils are equal, round, and reactive to light. Left eye exhibits no discharge. No scleral icterus.  Neck:  Neck supple. No JVD present. No tracheal deviation present. No thyromegaly present.  Cardiovascular: Normal rate, regular rhythm, normal heart sounds and intact distal pulses.   No murmur heard. Pulmonary/Chest: Effort normal and breath sounds normal. No respiratory distress. She has no wheezes. She has no rales. She exhibits no tenderness.  Abdominal: Soft. Bowel sounds are normal. She exhibits no distension and no mass. There is no tenderness. There is no rebound and no guarding.  Genitourinary:       She has a circumferential external hemorrhoids. No thrombosis. No fissure. I can see a little bit of internal hemorrhoidal prolapse when I spread the gluteal areas. Digital rectal exam reveals increased sphincter tone but no stenosis. Anoscopy was performed. I was able to get the standard anooscope in place and she does have internal hemorrhoids on the right and on the left. Otherwise the mucosa looked healthy. There was no active bleeding.  Musculoskeletal: She exhibits no edema and no tenderness.  Lymphadenopathy:    She has no cervical adenopathy.  Neurological: She is alert and oriented to person, place, and time. She exhibits normal muscle tone. Coordination normal.  Skin: Skin is warm. No rash noted. She is not diaphoretic. No erythema. No pallor.  Psychiatric: She has a normal mood and affect. Her behavior is normal. Judgment and thought content normal.    Data Reviewed Old chart and records. Dr. Jodelle Green notes.  Assessment    External hemorrhoids, complicated by pain and swelling.  Internal hemorrhoids, complicated by frequent bleeding  History laparoscopic myomectomy  Nulliparous    Plan    We had a long discussion about options for treatment. We talked about continuing topical medications, treatment of constipation which is all feasible in the short term. She would like to have something done definitively. We talked about sclerotherapy and rubber banding for internal  hemorrhoids but she is aware that does not address the external hemorrhoidal situation, which is somewhat significant in size.  We decided to go ahead and schedule for formal internal and external hemorrhoidectomy.  I've discussed indications and details of the surgery with her. I discussed the techniques and numerous risks of the surgery. She knows she will need to be out of work for 7-10 days. Her questions were answered. She understands these issues. She agrees with this plan.  She will do a rectal prep preop. She may need to stay one minor may be able to go home the same day.       Angelia Mould. Derrell Lolling, M.D., [facility] General and Minimally invasive Surgery Breast and Colorectal Surgery Office:   [phone] Pager:   [phone]  06/13/2012, 12:55 PM

## 2012-06-14 ENCOUNTER — Telehealth (INDEPENDENT_AMBULATORY_CARE_PROVIDER_SITE_OTHER): Payer: Self-pay | Admitting: General Surgery

## 2012-06-14 NOTE — Telephone Encounter (Signed)
Called pt to answer some questions about her surgery.  She wanted to confirm it would be out-patient (yes) and would someone have to stay at [facility] the whole time or could she be dropped off and picked up later?  Suggested it would be better for someone to stay, but could run an errand and come back.

## 2012-06-19 HISTORY — PX: HEMORRHOID SURGERY: SHX153

## 2012-06-20 ENCOUNTER — Telehealth (INDEPENDENT_AMBULATORY_CARE_PROVIDER_SITE_OTHER): Payer: Self-pay | Admitting: General Surgery

## 2012-06-20 NOTE — Telephone Encounter (Signed)
Called patient home number and left message advising that the procedure she discussed in message left would not resolve the issue and thus the procedure she is having tomorrow.

## 2012-06-20 NOTE — Telephone Encounter (Signed)
Pt has surgery scheduled for tomorrow and has some questions about the particular procedure she is having done vs the display board in the [facility] lobby which describes PPH.  Please call home or cell number.

## 2012-06-21 ENCOUNTER — Other Ambulatory Visit (INDEPENDENT_AMBULATORY_CARE_PROVIDER_SITE_OTHER): Payer: Self-pay | Admitting: General Surgery

## 2012-06-21 DIAGNOSIS — K648 Other hemorrhoids: Secondary | ICD-10-CM

## 2012-06-21 DIAGNOSIS — K644 Residual hemorrhoidal skin tags: Secondary | ICD-10-CM

## 2012-06-28 ENCOUNTER — Encounter (INDEPENDENT_AMBULATORY_CARE_PROVIDER_SITE_OTHER): Payer: Self-pay | Admitting: General Surgery

## 2012-06-28 ENCOUNTER — Telehealth (INDEPENDENT_AMBULATORY_CARE_PROVIDER_SITE_OTHER): Payer: Self-pay

## 2012-06-28 NOTE — Telephone Encounter (Signed)
Patient requesting RTW note, patient s/p Hemorrhoidectomy & Proctoscopy on 06/21/12.  Patient plans to return to work on 07/11/12 and need's release from our office.  Please call patient once RTW note is available.

## 2012-06-29 ENCOUNTER — Telehealth (INDEPENDENT_AMBULATORY_CARE_PROVIDER_SITE_OTHER): Payer: Self-pay | Admitting: General Surgery

## 2012-06-29 NOTE — Telephone Encounter (Signed)
Called back patient based on message left on 06/28/12. Advised patient that I will give information to Dr. Derrell Lolling to sign. However, he is at hospital all this week. Advised that if he comes into the office later this week, I will get him to sign the return to work note for 07/11/12, otherwise it will have to be on Monday the 12th when he has clinic. Patient agreed and requested it be faxed to her HR Department at [phone].

## 2012-06-29 NOTE — Progress Notes (Signed)
Faxed back response to [employer] advising medication to be issued was Norco 5-325 quantity of 40, 1 to 2 po every 4 prn for pain. Copy of faxed response approved by Dr. Derrell Lolling over the phone sent to fax # [phone].

## 2012-07-04 ENCOUNTER — Encounter (INDEPENDENT_AMBULATORY_CARE_PROVIDER_SITE_OTHER): Payer: Self-pay | Admitting: General Surgery

## 2012-07-04 NOTE — Progress Notes (Signed)
Faxed signed (by Dr. Derrell Lolling) return to work to [facility] of [facility] fax # [phone] at the request of patient. Original mailed directly to patient and copy sent to medical records to be scanned into chart.

## 2012-07-11 ENCOUNTER — Ambulatory Visit (INDEPENDENT_AMBULATORY_CARE_PROVIDER_SITE_OTHER): Payer: BC Managed Care – PPO | Admitting: General Surgery

## 2012-07-11 ENCOUNTER — Encounter (INDEPENDENT_AMBULATORY_CARE_PROVIDER_SITE_OTHER): Payer: Self-pay | Admitting: General Surgery

## 2012-07-11 VITALS — BP 108/68 | HR 80 | Temp 97.2°F | Resp 12 | Ht 63.0 in | Wt 184.4 lb

## 2012-07-11 DIAGNOSIS — K649 Unspecified hemorrhoids: Secondary | ICD-10-CM

## 2012-07-11 NOTE — Progress Notes (Signed)
Patient ID: YOUNG BRIM, female   DOB: Dec 10, 1965, 46 y.o.   MRN: [id number]  History: This patient underwent internal and external hemorrhoidectomy, 3 columns on 06/21/2012. She has done very well, considering the extent of surgery. Pain was under control within one week. No bleeding for the last 7 days. Bowel movement are moving easier. A still little bit of discomfort.  Exam: Patient looks well. No distress. Rectal exam reveals a healthy gluteal skin and anoderm. Minimal skin tags remained. No infection or purulent drainage. Suture lines intact.  Assessment: Internal and external hemorrhoids, recovering uneventfully following 3 column internal/external hemorrhoidectomy  Plan: High fiber diet, forced hydration, and daily fiber strongly recommended. MiraLAX if needed  Return to see me if she remains symptomatic at all after 4 more weeks.    Angelia Mould. Derrell Lolling, M.D., [facility] General and Minimally invasive Surgery Breast and Colorectal Surgery Office:   [phone] Pager:   [phone]

## 2012-07-11 NOTE — Patient Instructions (Signed)
Your examination today reveals that the scars from the hemorrhoid surgery are healing very nicely.  You are  advised to drink 6 glasses of water a day, and take MiraLAX or Metamucil on a daily basis. Avoid fatty foods.  Return to see Dr. Derrell Lolling if any further problems arise. You should be feeling back to normal by October 1.

## 2012-12-24 ENCOUNTER — Ambulatory Visit (INDEPENDENT_AMBULATORY_CARE_PROVIDER_SITE_OTHER): Payer: BC Managed Care – PPO | Admitting: Family Medicine

## 2012-12-24 VITALS — BP 126/78 | HR 103 | Temp 99.6°F | Resp 17 | Ht 64.5 in | Wt 183.0 lb

## 2012-12-24 DIAGNOSIS — J029 Acute pharyngitis, unspecified: Secondary | ICD-10-CM

## 2012-12-24 DIAGNOSIS — J020 Streptococcal pharyngitis: Secondary | ICD-10-CM

## 2012-12-24 LAB — POCT RAPID STREP A (OFFICE): Rapid Strep A Screen: POSITIVE — AB

## 2012-12-24 MED ORDER — FLUCONAZOLE 150 MG PO TABS: 150.0000 mg | ORAL_TABLET | Freq: Once | ORAL | Status: DC

## 2012-12-24 MED ORDER — AMOXICILLIN 875 MG PO TABS: 875.0000 mg | ORAL_TABLET | Freq: Two times a day (BID) | ORAL | Status: DC

## 2012-12-24 NOTE — Progress Notes (Signed)
Subjective: Patient is been feeling bad for a couple of days. She has recently had some dental work done. She has been hurting left upper tooth where she has a crown. She has some pain in her is maxillary sinus areas also. Her main problem though is been a sore throat for the last couple of days. She has runs a fever. She's not coughing. Has some nasal congestion. Digital flu shot. She is a Midwife. This did not have running antibiotics.  Objective: Pleasant alert lady who looks like she feels a little ill. Her TMs are normal. Throat erythematous without exudate. She has a dental bridge on the upper teeth. I do not see any obvious abscess around the teeth. She is a little tender along the upper gum margin. She has some tenderness over maxillary sinus regions. Neck supple without significant nodes. Chest is clear to auscultation. Heart regular without murmurs. Strep screen was done.  Assessment: Pharyngitis Sinus pain Recent dental work, with dental pain  Plan: Decide treatment after getting the strep screen.  Results for orders placed in visit on 12/24/12  POCT RAPID STREP A (OFFICE)      Component Value Range   Rapid Strep A Screen Positive (*) Negative   The patient was told that she does have streptococcal pharyngitis. She is to take the antibiotics and if she's not doing better she can get back to [place]. If the pain persists she is to see a Education officer, community.

## 2012-12-24 NOTE — Patient Instructions (Signed)

## 2013-02-08 ENCOUNTER — Other Ambulatory Visit: Payer: Self-pay

## 2013-02-08 DIAGNOSIS — Z1231 Encounter for screening mammogram for malignant neoplasm of breast: Secondary | ICD-10-CM

## 2013-02-28 ENCOUNTER — Telehealth: Payer: Self-pay | Admitting: Pulmonary Disease

## 2013-02-28 DIAGNOSIS — Z0000 Encounter for general adult medical examination without abnormal findings: Secondary | ICD-10-CM

## 2013-02-28 NOTE — Telephone Encounter (Signed)
I spoke with pt and is aware we will call her once labs are entered in the system.  Last OV 02/10/12 Pending 03/22/13 for CPX

## 2013-03-01 NOTE — Telephone Encounter (Signed)
i spoke with pt and is aware. Nothing further was needed 

## 2013-03-01 NOTE — Telephone Encounter (Signed)
Labs have been placed in the computer for the pt.  She can come in 1 week prior to her appt with SN.  thanks

## 2013-03-08 ENCOUNTER — Ambulatory Visit: Payer: BC Managed Care – PPO | Admitting: Pulmonary Disease

## 2013-03-10 ENCOUNTER — Ambulatory Visit
Admission: RE | Admit: 2013-03-10 | Discharge: 2013-03-10 | Disposition: A | Discharge status: Home / Self Care | Payer: BC Managed Care – PPO | Source: Ambulatory Visit

## 2013-03-10 DIAGNOSIS — Z1231 Encounter for screening mammogram for malignant neoplasm of breast: Secondary | ICD-10-CM

## 2013-03-13 ENCOUNTER — Other Ambulatory Visit (INDEPENDENT_AMBULATORY_CARE_PROVIDER_SITE_OTHER): Payer: BC Managed Care – PPO

## 2013-03-13 DIAGNOSIS — Z0000 Encounter for general adult medical examination without abnormal findings: Secondary | ICD-10-CM

## 2013-03-13 LAB — CBC WITH DIFFERENTIAL/PLATELET
Basophils Relative: 0.6 % (ref 0.0–3.0)
Eosinophils Relative: 2.5 % (ref 0.0–5.0)
Hemoglobin: 12.5 g/dL (ref 12.0–15.0)
Lymphocytes Relative: 27.8 % (ref 12.0–46.0)
Monocytes Relative: 6.4 % (ref 3.0–12.0)
Neutro Abs: 4.8 10*3/uL (ref 1.4–7.7)
RBC: 4.25 Mil/uL (ref 3.87–5.11)

## 2013-03-13 LAB — HEPATIC FUNCTION PANEL
Albumin: 4 g/dL (ref 3.5–5.2)
Total Protein: 7.6 g/dL (ref 6.0–8.3)

## 2013-03-13 LAB — LDL CHOLESTEROL, DIRECT: Direct LDL: 155.9 mg/dL

## 2013-03-13 LAB — LIPID PANEL
Cholesterol: 209 mg/dL — ABNORMAL HIGH (ref 0–200)
HDL: 46 mg/dL (ref >39.00)
Total CHOL/HDL Ratio: 5
Triglycerides: 53 mg/dL (ref 0.0–149.0)

## 2013-03-13 LAB — BASIC METABOLIC PANEL
CO2: 27 mEq/L (ref 19–32)
Calcium: 9.1 mg/dL (ref 8.4–10.5)
Creatinine, Ser: 1 mg/dL (ref 0.4–1.2)
Glucose, Bld: 97 mg/dL (ref 70–99)

## 2013-03-13 LAB — IBC PANEL: Iron: 57 ug/dL (ref 42–145)

## 2013-03-13 LAB — TSH: TSH: 0.93 u[IU]/mL (ref 0.35–5.50)

## 2013-03-14 LAB — VITAMIN D 25 HYDROXY (VIT D DEFICIENCY, FRACTURES): Vit D, 25-Hydroxy: 36 ng/mL (ref 30–89)

## 2013-03-22 ENCOUNTER — Encounter: Payer: Self-pay | Admitting: Pulmonary Disease

## 2013-03-22 ENCOUNTER — Ambulatory Visit (INDEPENDENT_AMBULATORY_CARE_PROVIDER_SITE_OTHER)
Admission: RE | Admit: 2013-03-22 | Discharge: 2013-03-22 | Disposition: A | Discharge status: Home / Self Care | Payer: BC Managed Care – PPO | Source: Ambulatory Visit | Attending: Pulmonary Disease | Admitting: Pulmonary Disease

## 2013-03-22 ENCOUNTER — Ambulatory Visit (INDEPENDENT_AMBULATORY_CARE_PROVIDER_SITE_OTHER): Payer: BC Managed Care – PPO | Admitting: Pulmonary Disease

## 2013-03-22 VITALS — BP 130/74 | HR 67 | Temp 98.3°F | Ht 64.5 in | Wt 178.0 lb

## 2013-03-22 DIAGNOSIS — Z0000 Encounter for general adult medical examination without abnormal findings: Secondary | ICD-10-CM

## 2013-03-22 DIAGNOSIS — E7800 Pure hypercholesterolemia, unspecified: Secondary | ICD-10-CM

## 2013-03-22 DIAGNOSIS — K649 Unspecified hemorrhoids: Secondary | ICD-10-CM

## 2013-03-22 DIAGNOSIS — G43909 Migraine, unspecified, not intractable, without status migrainosus: Secondary | ICD-10-CM

## 2013-03-22 DIAGNOSIS — D649 Anemia, unspecified: Secondary | ICD-10-CM

## 2013-03-22 NOTE — Patient Instructions (Addendum)
Today we updated your med list in our EPIC system...     Continue your women's multivit daily & add back an Iron supplement (Slo-Fe, Integra-plus, Ferrous sulfate)...  Let's get on track w/ our diet & exercise program...    Check into the Mediterranean diet and into the GLYCEMIC Index to avoid foods w/ a high glycemic index...  Today we did your follow up CXR & EKG...    We will contact you w/ the results when available...   Call for any questions...  Let's plan a follow up visit in 8yr, sooner if needed for problems.Marland KitchenMarland Kitchen

## 2013-03-22 NOTE — Progress Notes (Signed)
Subjective:     Patient ID: [name], female   DOB: [birth year], 47 y.o.   MRN: [id number]  HPI 47 y/o BF here for follow up visit and CPX... she is [place] Cherry's daughter & Arlee Muslim (passed away Apr 06, 2009) granddaughter...   ~  February 06, 2011:  [name] has had several recent visits> TP w/ [facility], rx Hydrocort cream & improved, refer to [facility] (DrWeatherly) & may need surg;  also requested allergy eval after eating a meal at [facility] one night 1/12 w/ breakout of blisters around her lips- went to [facility] w/ Pred rx + antihist & resolved; allergy eval DrYoung 3/12 w/ RAST testing> IgE=24, Regional Allergy Profile all neg & Food Profile all neg x MILK class 2...    She has been trying to control her Chol w/ diet alone> "just watching it" she says & unfortunately not exercising much, wt up 4# this yr to 183#; FLP shows TChol 223, TG 44, HDL 54, LDL 158.    She has hx anemia related to heavy menses & uterine fibroids w/ prev myomectomy 06-Apr-2009 by [facility]; she has continued bleeding & an IUD- GYN aware;  CBC shows Hg 12.5 & Fe 62 (17%sat)> continue Fe w/ VitC. CXR 3/12 showed normal heart size, clear lungs, NAD.Marland KitchenMarland Kitchen EKG 3/12 showed NSR, rate72, WNL, NAD...  ~  February 10, 2012:  Yearly ROV & [name] reports another (only her second) allergic reaction> very similar to 07-Apr-2011 w/ some perioral swelling & lips breaking out w/ blisters she says; didn't come for [place] to see it (?could it be HSV1?) & treated self w/ Benedryl & improved over several days; she indicated that she may seek allergy second opinion & I gave her copies of last yrs RAST test results;  rec to use Benedryl every 4-6H & add Pepcid 20mg  Bid...    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    Chol> she has resisted rec for meds; prefers diet & exercise but wt unchanged, & we reviewed recs; FLP showed TChol 203, TG 46, HDL 55, LDL 143    Overweight> wt is about the same 181# & she admits difficulty w/  diet & exercise due to time contraints at work...    Hemorrhoids> on AnusolHC cream as needed; she saw DrWeatherly last yr & he rec surg, she is holding off but may try to sched for this summer.    Hx Migraines> states these are stable w/ OTC analgesics as needed...    Hx Anemia> this was due to heavy menses & hx fibroids; surg DrLavoie & improved; Hg=12.6, Fe=65 (21%sat); rec to continue FeSO4... LABS 3/13:  FLP- not at goal w/ LDL 143;  Chems- wnl;  CBC- ok w/ Hg=12.6;  TSH=0.94;  Fe=65 (21%sat)   ~  March 22, 2013:  Yearly ROV & CPX> [name] reports a good yr & denies new complaints or concerns;  We reviewed the following medical problems during today's office visit >>     ?Allergic Diathesis?> hx lip swelling/ blisters on 2 occas ?etiology, prev RAST testing w/ IgE=24 & all neg x class2 Milk antibody levels; no recurrence.    Chol> on diet alone; she has declined meds in the past; FLP 4/14 showed TChol 209, TG 53, HDL 46, LDL 156    Overweight> wt is down 3# to 178# & she admits difficulty w/ diet & exercise due to time contraints at work...    Hemorrhoids> on  AnusolHC cream as needed; she had Hem surg 7/13 by [facility] & improved...    Hx Migraines> she denies recent HAs; states these are stable w/ OTC analgesics as needed...    Hx Anemia> this was due to heavy menses & hx fibroids; surg DrLavoie & improved; Hg=12.5, Fe=57 (18%sat); rec to continue FeSO4 daily. We reviewed prob list, meds, xrays and labs> see below for updates >> he had the 2013 Flu vaccine 10/13... CXR 4/14 showed normal heart size, clear lungs, NAD.Marland KitchenMarland Kitchen EKG 4/14 NSR, rate62, wnl, NAD... LABS 4/14:  FLP- not at goals w/ LDL=156;  Chems- wnl;  CBC- wnl w/ Hg=12.5, Fe=57;  TSH=0.93;  VitD=36          PROBLEM LIST:    PHYSICAL EXAMINATION (ICD-V70.0) - age 36> GYN= DrLavoie;  no GI problems noted;  she had the 2011 Flu vaccine...  HYPERCHOLESTEROLEMIA (ICD-272.0) - on diet alone... ~  FLP 11/08 showed TChol 206, TG 71, HDL 47,  LDL 151... rec- diet therapy, may need meds. ~  FLP 12/09 showed TChol 248, TG 115, HDL 59, LDL 163... rec to start Prav40 but she never did. ~  FLP 2/11 showed TChol 196, TG 39, HDL 68, LDl 120... continue diet efforts. ~  FLP 3/12 showed TChol 223, TG 44, HDL 54, LDL 158 ~  FLP 2/13 showed TChol 203, TG 46, HDL 55, LDL 143... She does not want meds, only option is diet + exercise. ~  FLP 4/14 showed TChol 209, TG 53, HDL 46, LDL 156  MIGRAINE HEADACHE (ICD-346.90) - she uses OTC Tylenol and Advil as needed.  ANEMIA (ICD-285.9) - she has a hx of iron-defic anemia the result of heavy menses.Marland Kitchen. ?fibroid- [facility]... she takes FESO4 and VitC daily... she is on BCP's from DrCousins and she tells me that she is sched for fibroid ==> s/p uterine myomectomy 6/10 by [facility]... ~  labs 2/11 showed Hg= 11.4, MCV= 91, Fe= 36 (11%sat)... continue Fe & Vit C daily... ~  Labs 3/12 showed Hg= 12.5, MCV= 91, Fe= 62 (17%sat)... ~  Labs 2/13 showed Hg= 12.6, MCV= 90, Fe= 65 (21%sat)... ~  Labs 4/14 showed Hg= 12.5, MCV= 89, Fe= 57 (18%sat)...  HEALTH MAINTENANCE>  ~  GI:   ~  GYN:  Gyn= DrLavoie, Mammogram 4/14 was neg... ~  Immuniz:  She had the 2013 Flu vaccine 10/13;  TDAP given 2009...   Past Surgical History  Procedure Laterality Date  . Myomectomy  04/2009    Dr. Seymour Bars  . Eye surgery      Outpatient Encounter Prescriptions as of 03/22/2013  Medication Sig Dispense Refill  . amoxicillin (AMOXIL) 875 MG tablet Take 1 tablet (875 mg total) by mouth 2 (two) times daily.  20 tablet  0  . Ascorbic Acid (VITAMIN C) 500 MG CAPS Take 1 capsule by mouth daily.      . cholecalciferol (VITAMIN D) 1000 UNITS tablet Take 1,000 Units by mouth daily.      . fluconazole (DIFLUCAN) 150 MG tablet Take 1 tablet (150 mg total) by mouth once.  1 tablet  0  . hydrocortisone 2.5 % cream Apply 1 application topically 2 (two) times daily as needed.       No facility-administered encounter  medications on file as of 03/22/2013.    No Known Allergies   Current Medications, Allergies, Past Medical History, Past Surgical History, Family History, and Social History were reviewed in [employer] record. Father died age  58 w/ ICH, hypertensive bleed... Mother, [place], alive- age 52, Hx HBP, CAD, stroke, DM, Chol... 1 Bro passed from suicide... 1 Sis alive w/ hx HBP, obese...   Review of Systems        The patient complains of hematochezia.  The patient denies fever, chills, sweats, anorexia, fatigue, weakness, malaise, weight loss, sleep disorder, blurring, diplopia, eye irritation, eye discharge, vision loss, eye pain, photophobia, earache, ear discharge, tinnitus, decreased hearing, nasal congestion, nosebleeds, sore throat, hoarseness, chest pain, palpitations, syncope, dyspnea on exertion, orthopnea, PND, peripheral edema, cough, dyspnea at rest, excessive sputum, hemoptysis, wheezing, pleurisy, nausea, vomiting, diarrhea, constipation, change in bowel habits, abdominal pain, melena, jaundice, gas/bloating, indigestion/heartburn, dysphagia, odynophagia, dysuria, hematuria, urinary frequency, urinary hesitancy, nocturia, incontinence, back pain, joint pain, joint swelling, muscle cramps, muscle weakness, stiffness, arthritis, sciatica, restless legs, leg pain at night, leg pain with exertion, rash, itching, dryness, suspicious lesions, paralysis, paresthesias, seizures, tremors, vertigo, transient blindness, frequent falls, frequent headaches, difficulty walking, depression, anxiety, memory loss, confusion, cold intolerance, heat intolerance, polydipsia, polyphagia, polyuria, unusual weight change, abnormal bruising, bleeding, enlarged lymph nodes, urticaria, allergic rash, hay fever, and recurrent infections.     Objective:   Physical Exam    WD, petite, 46 y/o BF in NAD... GENERAL:  Alert & oriented; pleasant & cooperative... HEENT:  [place]/AT, EOM-wnl,  PERRLA, EACs-clear, TMs-wnl, NOSE-clear, THROAT-clear & wnl. NECK:  Supple w/ full ROM; no JVD; normal carotid impulses w/o bruits; no thyromegaly or nodules palpated; no lymphadenopathy. CHEST:  Clear to P & A; without wheezes/ rales/ or rhonchi. HEART:  Regular Rhythm; without murmurs/ rubs/ or gallops. ABDOMEN:  Soft & nontender; normal bowel sounds; no organomegaly or masses detected. EXT: without deformities or arthritic changes; no varicose veins/ venous insuffic/ or edema. NEURO:  CN's intact; motor testing normal; sensory testing normal; gait normal & balance OK. DERM:  No lesions noted; no rash etc...  RADIOLOGY DATA:  Reviewed in the EPIC EMR & discussed w/ the patient...    LABORATORY DATA:  Reviewed in the EPIC EMR & discussed w/ the patient...    Assessment:     CPX>>  ?Allergic Diathesis?>  ?Etiology, 2 episodes now, self limited & resolved slowly w/ Benedryl; asked her to call us to come by so we can see any recurrent episodes; she may seek allergy second opinion & I gave her copies of her prev RAST results...  Chol>  She declines meds rx still & prefers diet + exercise; discussed need to lose weight...  Overweight>  As noted we reviewed diet & exercise prescriptions...  Hemorrhoids>  She uses ANUSOL HC Cream as needed; now s/p hem surg 7/13 by Lurline Hare...  Hx Migraines>  She notes this has been stable despite all her stress at work etc...  Hx Anemia>  Improved & stable w/ Hg=12.6; rec to continue Ferro[employer]els daily...     Plan:     Patient's Medications  New Prescriptions   No medications on file  Previous Medications   AMOXICILLIN (AMOXIL) 875 MG TABLET    Take 1 tablet (875 mg total) by mouth 2 (two) times daily.   ASCORBIC ACID (VITAMIN C) 500 MG CAPS    Take 1 capsule by mouth daily.   CHOLECALCIFEROL (VITAMIN D) 1000 UNITS TABLET    Take 1,000 Units by mouth daily.   FLUCONAZOLE (DIFLUCAN) 150 MG TABLET    Take 1 tablet (150 mg total) by mouth once.    HYDROCORTISONE 2.5 % CREAM    Apply 1  application topically 2 (two) times daily as needed.  Modified Medications   No medications on file  Discontinued Medications   No medications on file

## 2013-03-23 LAB — HM PAP SMEAR: HM Pap smear: NORMAL

## 2013-03-24 ENCOUNTER — Telehealth: Payer: Self-pay | Admitting: Pulmonary Disease

## 2013-03-24 NOTE — Telephone Encounter (Signed)
I spoke with patient about results and she verbalized understanding and had no questions 

## 2014-02-09 ENCOUNTER — Other Ambulatory Visit: Payer: Self-pay

## 2014-02-09 DIAGNOSIS — Z1231 Encounter for screening mammogram for malignant neoplasm of breast: Secondary | ICD-10-CM

## 2014-02-21 LAB — HM MAMMOGRAPHY: HM MAMMO: NORMAL

## 2014-02-26 ENCOUNTER — Telehealth: Payer: Self-pay | Admitting: Pulmonary Disease

## 2014-02-26 NOTE — Telephone Encounter (Signed)
Called and spoke with pt and she was given the number to the [place] office to call and set up with new primary care.  Pt will call and set this up for may.  Nothing further is needed.

## 2014-03-12 ENCOUNTER — Ambulatory Visit
Admission: RE | Admit: 2014-03-12 | Discharge: 2014-03-12 | Disposition: A | Discharge status: Home / Self Care | Payer: BC Managed Care – PPO | Source: Ambulatory Visit

## 2014-03-12 DIAGNOSIS — Z1231 Encounter for screening mammogram for malignant neoplasm of breast: Secondary | ICD-10-CM

## 2014-05-18 ENCOUNTER — Ambulatory Visit (INDEPENDENT_AMBULATORY_CARE_PROVIDER_SITE_OTHER): Payer: BC Managed Care – PPO | Admitting: Family Medicine

## 2014-05-18 VITALS — BP 110/80 | HR 82 | Temp 98.4°F | Resp 18 | Ht 64.0 in | Wt 185.6 lb

## 2014-05-18 DIAGNOSIS — J0100 Acute maxillary sinusitis, unspecified: Secondary | ICD-10-CM

## 2014-05-18 MED ORDER — AMOXICILLIN 875 MG PO TABS: 875.0000 mg | ORAL_TABLET | Freq: Two times a day (BID) | ORAL | Status: DC

## 2014-05-18 NOTE — Progress Notes (Signed)
Patient ID: [name] MRN: [id number], DOB: 05/05/1966, 48 y.o. Date of Encounter: 05/18/2014, 2:31 PM  Primary Physician: Noralee Space, MD  Chief Complaint:  Chief Complaint  Patient presents with  . Nasal Congestion    Since monday  . Generalized Body Aches    HPI: 48 y.o. year old female presents with 4 day history of nasal congestion, post nasal drip, sore throat, sinus pressure, and cough. Afebrile. No chills. Nasal congestion thick yellow. Sinus pressure is the worst symptom. Cough is productive secondary to post nasal drip and not associated with time of day. Ears feel full, leading to sensation of muffled hearing. Has tried OTC cold preps without success. No GI complaints.   Work:  Pharmacist, hospital at [employer], dusty environment  No recent antibiotics, recent travels, vomiting, or sick contacts   No leg trauma, sedentary periods, h/o cancer, or tobacco use.  Past Medical History  Diagnosis Date  . Hypercholesteremia   . Hemorrhoids   . Migraine headache   . Anemia   . 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 Nasal congestion   . Anal bleeding   . Blood in stool   . Easy bruising   . Allergy      Home Meds: Prior to Admission medications   Medication Sig Start Date End Date Taking? Authorizing Provider  loratadine (CLARITIN) 10 MG tablet Take 10 mg by mouth daily.   Yes Historical Provider, MD    Allergies: No Known Allergies  History   Social History  . Marital Status: Married    Spouse Name: spencer    Number of Children: 0  . 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 Smoking status: Never Smoker   . Smokeless tobacco: Never Used  . Alcohol Use: No     Comment: socially - sometimes  . Drug Use: No  . Sexual Activity: Yes    Birth Control/ Protection: IUD   Other Topics Concern  . Not on file   Social History Narrative  .  No narrative on file     Review of Systems: Constitutional: negative for chills, fever, night sweats or weight changes Cardiovascular: negative for chest pain or palpitations Respiratory: negative for hemoptysis, wheezing, or shortness of breath Abdominal: negative for abdominal pain, nausea, vomiting or diarrhea Dermatological: negative for rash Neurologic: negative for headache   Physical Exam: Blood pressure 110/80, pulse 82, temperature 98.4 F (36.9 C), temperature source Oral, resp. rate 18, height 5\' 4"  (1.626 m), weight 185 lb 9.6 oz (84.188 kg), SpO2 99.00%., Body mass index is 31.84 kg/(m^2). General: Well developed, well nourished, in no acute distress. Head: Normocephalic, atraumatic, eyes without discharge, sclera non-icteric, nares are congested. Bilateral auditory canals clear, TM's are without perforation, pearly grey with reflective cone of light bilaterally. Serous effusion bilaterally behind TM's. Maxillary sinus TTP. Oral cavity moist, dentition normal. Posterior pharynx with post nasal drip and mild erythema. No peritonsillar abscess or tonsillar exudate. Neck: Supple. No thyromegaly. Full ROM. No lymphadenopathy. Lungs: Clear bilaterally to auscultation without wheezes, rales, or rhonchi. Breathing is unlabored.  Heart: RRR with S1 S2. No murmurs, rubs, or gallops appreciated. Msk:  Strength and tone normal for age. Extremities: No clubbing or cyanosis. No edema. Neuro: Alert and oriented X 3. Moves all extremities spontaneously. CNII-XII grossly in tact. Psych:  Responds  to questions appropriately with a normal affect.   Labs:   ASSESSMENT AND PLAN:  48 y.o. year old female with sinusitis Subacute maxillary sinusitis - Plan: amoxicillin (AMOXIL) 875 MG tablet   -Tylenol/Motrin prn -Rest/fluids -RTC precautions -RTC 3-5 days if no improvement  Signed, Robyn Haber, MD 05/18/2014 2:31 PM

## 2014-05-18 NOTE — Patient Instructions (Signed)
Sinusitis Sinusitis is redness, soreness, and swelling (inflammation) of the paranasal sinuses. Paranasal sinuses are air pockets within the bones of your face (beneath the eyes, the middle of the forehead, or above the eyes). In healthy paranasal sinuses, mucus is able to drain out, and air is able to circulate through them by way of your nose. However, when your paranasal sinuses are inflamed, mucus and air can become trapped. This can allow bacteria and other germs to grow and cause infection. Sinusitis can develop quickly and last only a short time (acute) or continue over a long period (chronic). Sinusitis that lasts for more than 12 weeks is considered chronic.  CAUSES  Causes of sinusitis include:  Allergies.  Structural abnormalities, such as displacement of the cartilage that separates your nostrils (deviated septum), which can decrease the air flow through your nose and sinuses and affect sinus drainage.  Functional abnormalities, such as when the small hairs (cilia) that line your sinuses and help remove mucus do not work properly or are not present. SYMPTOMS  Symptoms of acute and chronic sinusitis are the same. The primary symptoms are pain and pressure around the affected sinuses. Other symptoms include:  Upper toothache.  Earache.  Headache.  Bad breath.  Decreased sense of smell and taste.  A cough, which worsens when you are lying flat.  Fatigue.  Fever.  Thick drainage from your nose, which often is green and may contain pus (purulent).  Swelling and warmth over the affected sinuses. DIAGNOSIS  Your caregiver will perform a physical exam. During the exam, your caregiver may:  Look in your nose for signs of abnormal growths in your nostrils (nasal polyps).  Tap over the affected sinus to check for signs of infection.  View the inside of your sinuses (endoscopy) with a special imaging device with a light attached (endoscope), which is inserted into your  sinuses. If your caregiver suspects that you have chronic sinusitis, one or more of the following tests may be recommended:  Allergy tests.  Nasal culture--A sample of mucus is taken from your nose and sent to a lab and screened for bacteria.  Nasal cytology--A sample of mucus is taken from your nose and examined by your caregiver to determine if your sinusitis is related to an allergy. TREATMENT  Most cases of acute sinusitis are related to a viral infection and will resolve on their own within 10 days. Sometimes medicines are prescribed to help relieve symptoms (pain medicine, decongestants, nasal steroid sprays, or saline sprays).  However, for sinusitis related to a bacterial infection, your caregiver will prescribe antibiotic medicines. These are medicines that will help kill the bacteria causing the infection.  Rarely, sinusitis is caused by a fungal infection. In theses cases, your caregiver will prescribe antifungal medicine. For some cases of chronic sinusitis, surgery is needed. Generally, these are cases in which sinusitis recurs more than 3 times per year, despite other treatments. HOME CARE INSTRUCTIONS   Drink plenty of water. Water helps thin the mucus so your sinuses can drain more easily.  Use a humidifier.  Inhale steam 3 to 4 times a day (for example, sit in the bathroom with the shower running).  Apply a warm, moist washcloth to your face 3 to 4 times a day, or as directed by your caregiver.  Use saline nasal sprays to help moisten and clean your sinuses.  Take over-the-counter or prescription medicines for pain, discomfort, or fever only as directed by your caregiver. SEEK IMMEDIATE MEDICAL CARE IF:    You have increasing pain or severe headaches.  You have nausea, vomiting, or drowsiness.  You have swelling around your face.  You have vision problems.  You have a stiff neck.  You have difficulty breathing. MAKE SURE YOU:   Understand these  instructions.  Will watch your condition.  Will get help right away if you are not doing well or get worse. Document Released: 11/09/2005 Document Revised: 02/01/2012 Document Reviewed: 11/24/2011 [employer] Patient Information 2015 [employer]. This information is not intended to replace advice given to you by your health care provider. Make sure you discuss any questions you have with your health care provider.  

## 2014-05-30 ENCOUNTER — Telehealth: Payer: Self-pay | Admitting: Pulmonary Disease

## 2014-05-30 NOTE — Telephone Encounter (Signed)
Per SN: Pt can use nasal saline spray q 2hrs while awake, fluticasone 2 sprays each nostril BID OTC, zyrtec 10 mg QD, mucinex 600 mg 2 po BID, increase fluids   I called made pt aware of recs. Nothing further needed

## 2014-05-30 NOTE — Telephone Encounter (Signed)
Called spoke with pt. She has pending appt w/ new PCP 07/31/14. Pt was seen in [facility] 1 week ago-dx w/ sinus infection. Pt was placed on amox.  Pt c/o nasal and chest congestion, slight dry cough, no sense of smell still, slight wheezing in the mornings.  Denies any f/c/s/n/v/CP/PND Please advise SN thanks  No Known Allergies

## 2014-06-10 IMAGING — MG MM DIGITAL SCREENING BILAT
4 series · 4 of 4 positions shown · non-contrast
Comparison: 12/08/2005

CLINICAL DATA: Screening.

DIGITAL BILATERAL SCREENING MAMMOGRAM WITH CAD

[L MLO]
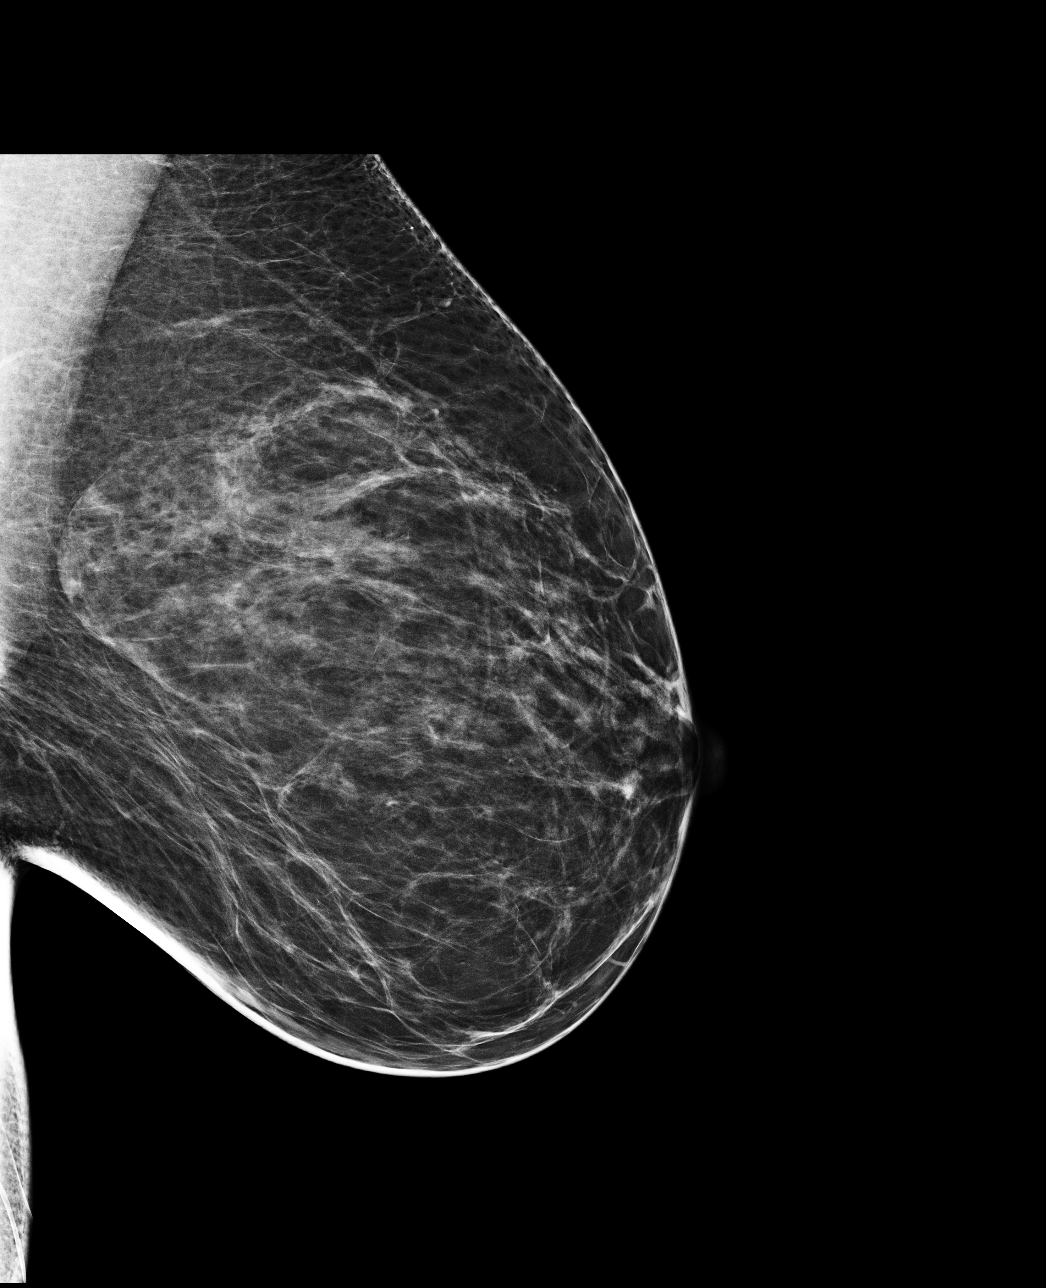

[R CC]
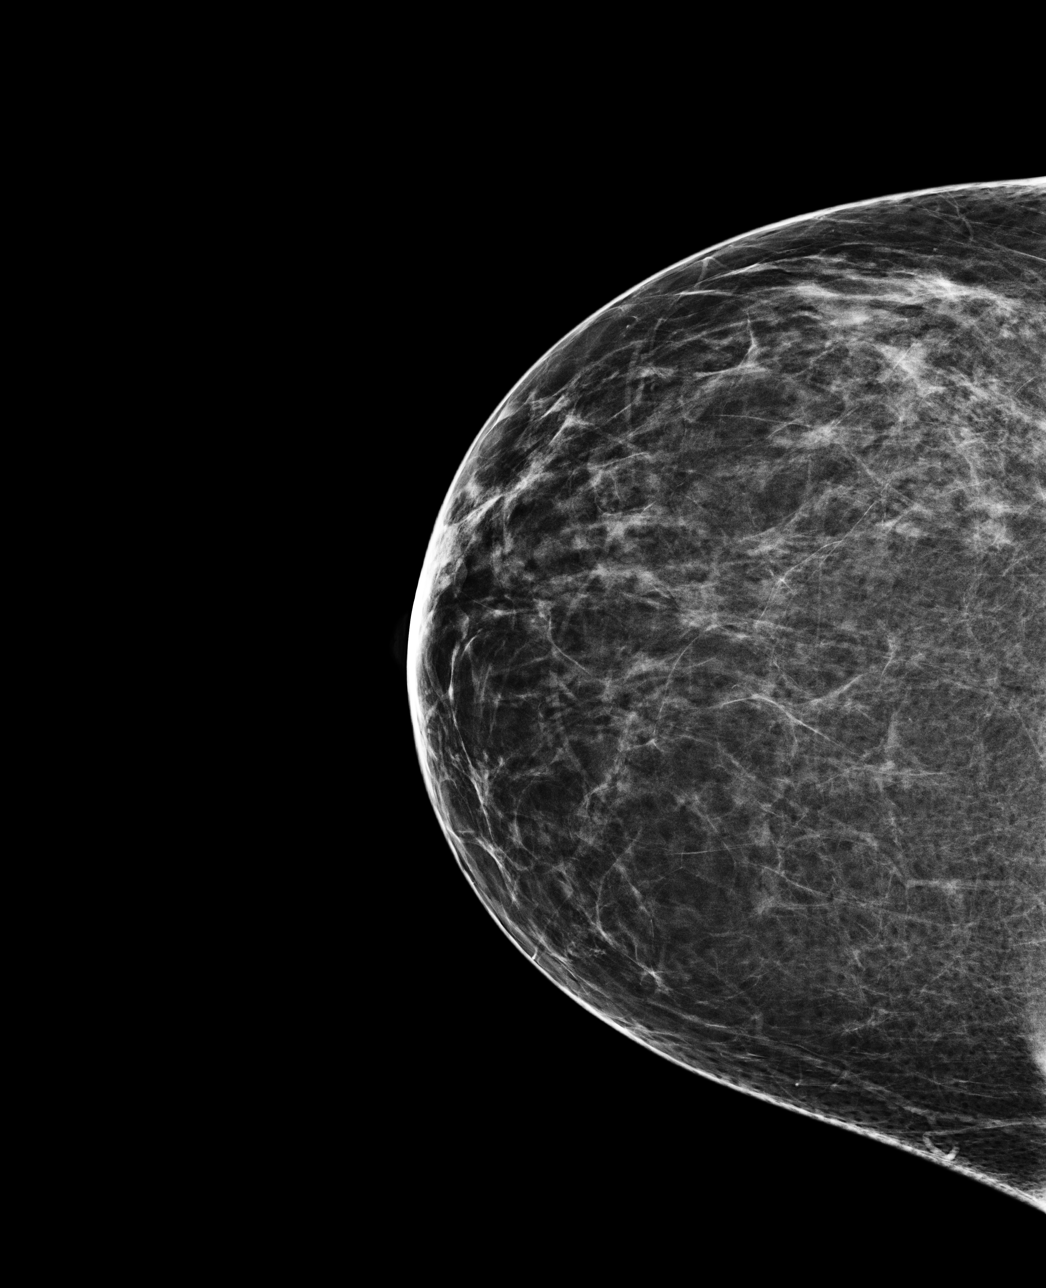

[R MLO]
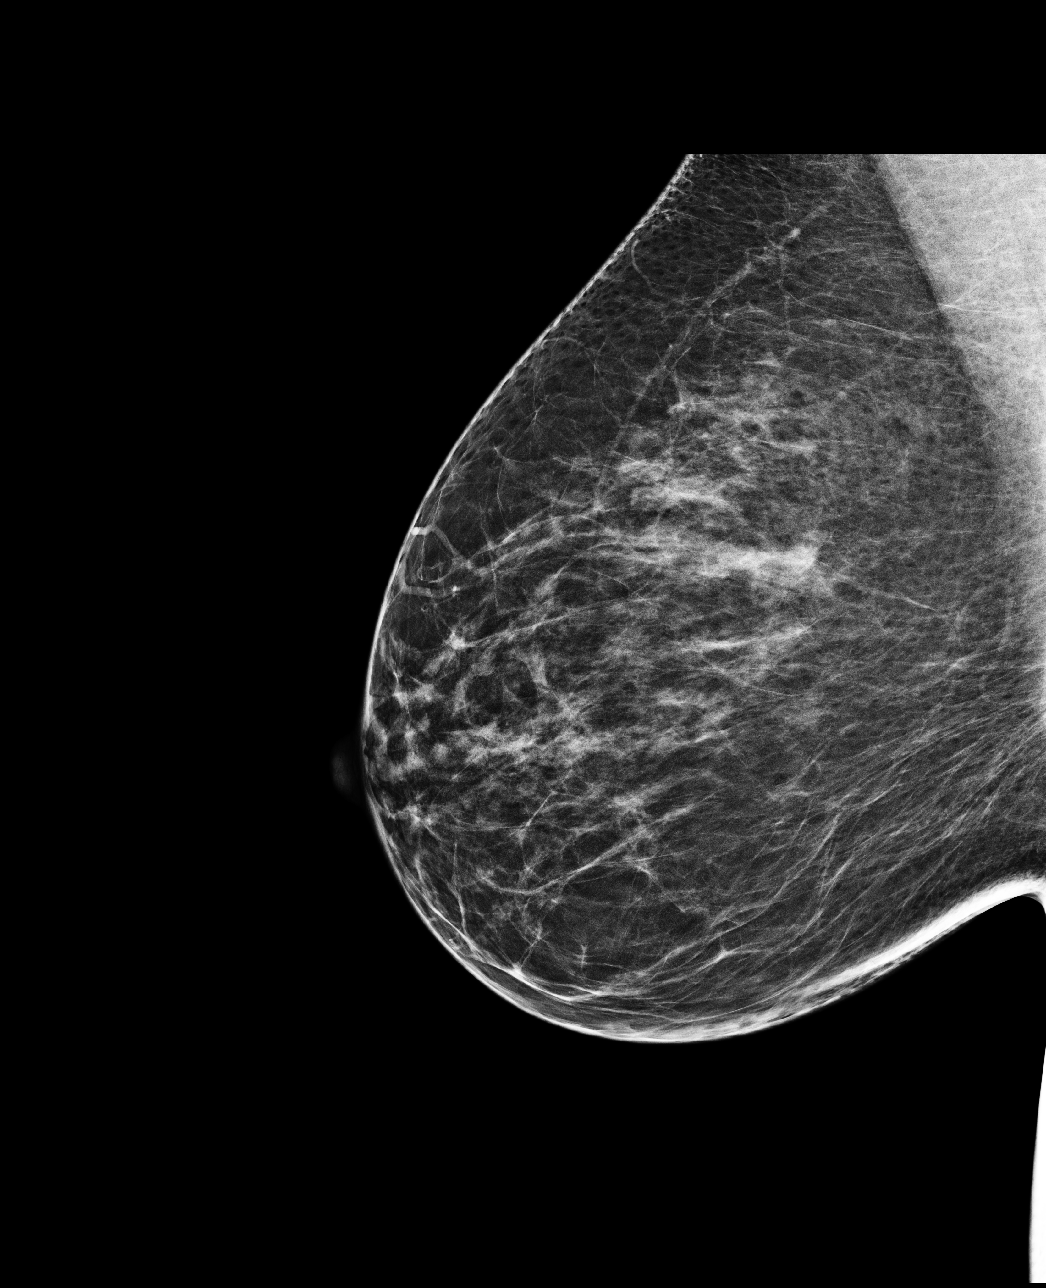

[L CC]
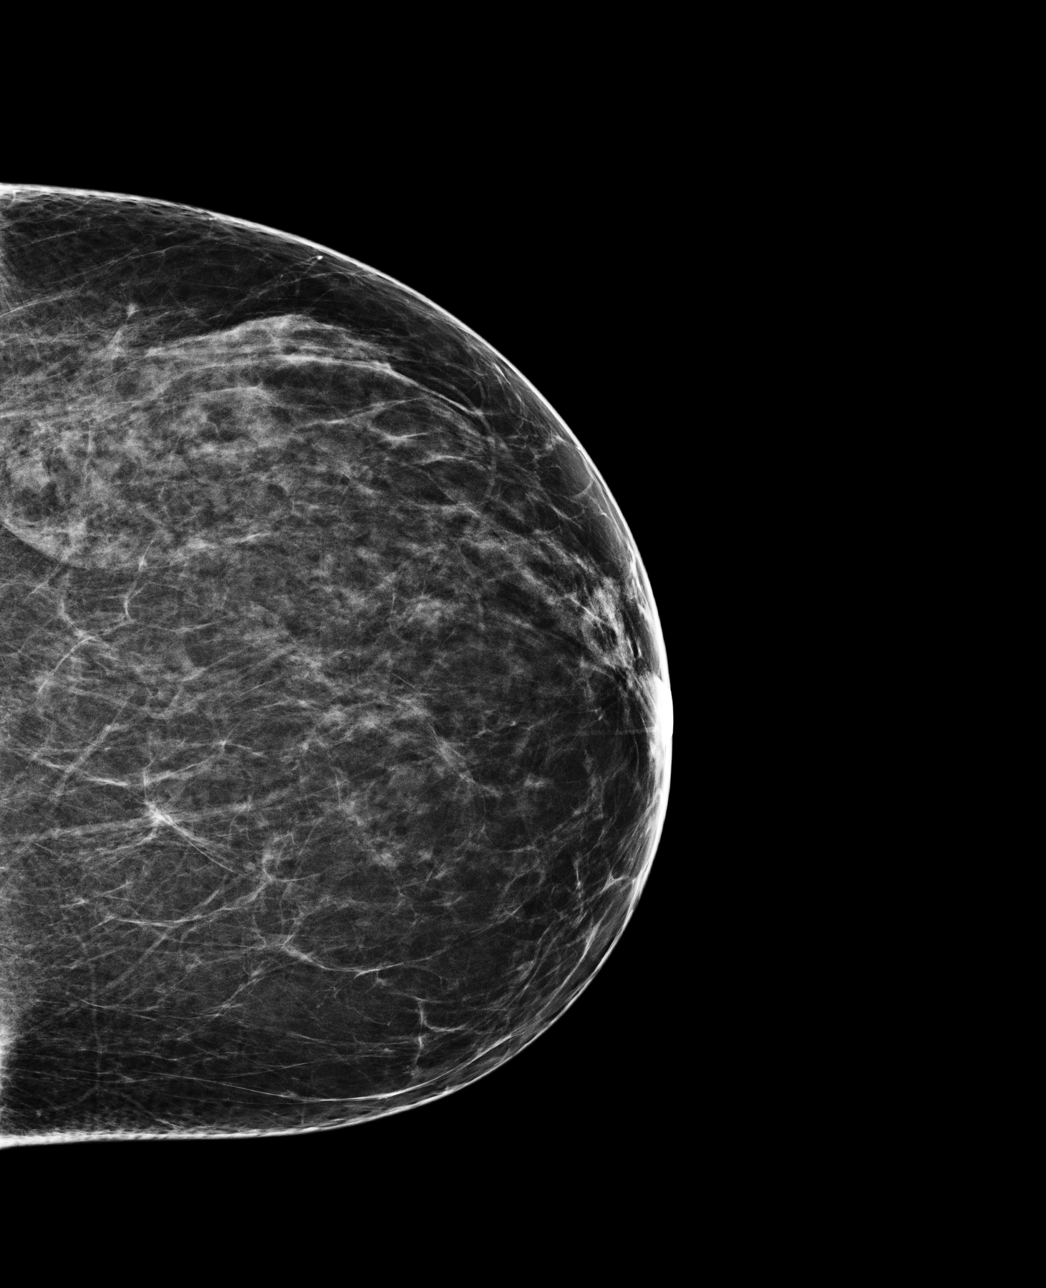

[4 of 4 positions shown; findings below may reference images not displayed]

FINDINGS: ACR Breast Density Category 2: There are scattered fibroglandular
densities.

There is no suspicious dominant mass, architectural distortion, or
calcification to suggest malignancy.

Images were processed with CAD.
IMPRESSION: No mammographic evidence of malignancy.

A result letter of this screening mammogram will be mailed directly
to the patient.

RECOMMENDATION:
Screening mammogram in one year. (Code:7E-D-LSW)

BI-RADS CATEGORY 1:  Negative.

## 2014-07-31 ENCOUNTER — Ambulatory Visit (INDEPENDENT_AMBULATORY_CARE_PROVIDER_SITE_OTHER): Payer: BC Managed Care – PPO | Admitting: Family Medicine

## 2014-07-31 ENCOUNTER — Encounter: Payer: Self-pay | Admitting: Family Medicine

## 2014-07-31 VITALS — BP 132/75 | HR 69 | Temp 98.7°F | Ht 64.5 in | Wt 182.2 lb

## 2014-07-31 DIAGNOSIS — Z0000 Encounter for general adult medical examination without abnormal findings: Secondary | ICD-10-CM

## 2014-07-31 DIAGNOSIS — E7800 Pure hypercholesterolemia, unspecified: Secondary | ICD-10-CM

## 2014-07-31 DIAGNOSIS — Z5189 Encounter for other specified aftercare: Secondary | ICD-10-CM

## 2014-07-31 DIAGNOSIS — Z23 Encounter for immunization: Secondary | ICD-10-CM

## 2014-07-31 DIAGNOSIS — D509 Iron deficiency anemia, unspecified: Secondary | ICD-10-CM

## 2014-07-31 DIAGNOSIS — T7840XD Allergy, unspecified, subsequent encounter: Secondary | ICD-10-CM

## 2014-07-31 DIAGNOSIS — R5381 Other malaise: Secondary | ICD-10-CM

## 2014-07-31 DIAGNOSIS — D649 Anemia, unspecified: Secondary | ICD-10-CM

## 2014-07-31 DIAGNOSIS — R5383 Other fatigue: Secondary | ICD-10-CM

## 2014-07-31 DIAGNOSIS — G43909 Migraine, unspecified, not intractable, without status migrainosus: Secondary | ICD-10-CM

## 2014-07-31 NOTE — Progress Notes (Signed)
Pre visit review using our clinic review tool, if applicable. No additional management support is needed unless otherwise documented below in the visit note. 

## 2014-07-31 NOTE — Patient Instructions (Addendum)
Decrease artificial sweeteners (stevia might be OK) Consider a probiotic such as Digestive Advantage or phillips Colon Health Vitamin 1000 to 2000 IU daily Krill oil or fish oil caps daily DASH Eating Plan DASH stands for "Dietary Approaches to Stop Hypertension." The DASH eating plan is a healthy eating plan that has been shown to reduce high blood pressure (hypertension). Additional health benefits may include reducing the risk of type 2 diabetes mellitus, heart disease, and stroke. The DASH eating plan may also help with weight loss. WHAT DO I NEED TO KNOW ABOUT THE DASH EATING PLAN? For the DASH eating plan, you will follow these general guidelines:  Choose foods with a percent daily value for sodium of less than 5% (as listed on the food label).  Use salt-free seasonings or herbs instead of table salt or sea salt.  Check with your health care provider or pharmacist before using salt substitutes.  Eat lower-sodium products, often labeled as "lower sodium" or "no salt added."  Eat fresh foods.  Eat more vegetables, fruits, and low-fat dairy products.  Choose whole grains. Look for the word "whole" as the first word in the ingredient list.  Choose fish and skinless chicken or [place] more often than red meat. Limit fish, poultry, and meat to 6 oz (170 g) each day.  Limit sweets, desserts, sugars, and sugary drinks.  Choose heart-healthy fats.  Limit cheese to 1 oz (28 g) per day.  Eat more home-cooked food and less restaurant, buffet, and fast food.  Limit fried foods.  Cook foods using methods other than frying.  Limit canned vegetables. If you do use them, rinse them well to decrease the sodium.  When eating at a restaurant, ask that your food be prepared with less salt, or no salt if possible. WHAT FOODS CAN I EAT? Seek help from a dietitian for individual calorie needs. Grains Whole grain or whole wheat bread. Brown rice. Whole grain or whole wheat pasta. Quinoa,  bulgur, and whole grain cereals. Low-sodium cereals. Corn or whole wheat flour tortillas. Whole grain cornbread. Whole grain crackers. Low-sodium crackers. Vegetables Fresh or frozen vegetables (raw, steamed, roasted, or grilled). Low-sodium or reduced-sodium tomato and vegetable juices. Low-sodium or reduced-sodium tomato sauce and paste. Low-sodium or reduced-sodium canned vegetables.  Fruits All fresh, canned (in natural juice), or frozen fruits. Meat and Other Protein Products Ground beef (85% or leaner), grass-fed beef, or beef trimmed of fat. Skinless chicken or [place]. Ground chicken or [place]. Pork trimmed of fat. All fish and seafood. Eggs. Dried beans, peas, or lentils. Unsalted nuts and seeds. Unsalted canned beans. Dairy Low-fat dairy products, such as skim or 1% milk, 2% or reduced-fat cheeses, low-fat ricotta or cottage cheese, or plain low-fat yogurt. Low-sodium or reduced-sodium cheeses. Fats and Oils Tub margarines without trans fats. Light or reduced-fat mayonnaise and salad dressings (reduced sodium). Avocado. Safflower, olive, or canola oils. Natural peanut or almond butter. Other Unsalted popcorn and pretzels. The items listed above may not be a complete list of recommended foods or beverages. Contact your dietitian for more options. WHAT FOODS ARE NOT RECOMMENDED? Grains White bread. White pasta. White rice. Refined cornbread. Bagels and croissants. Crackers that contain trans fat. Vegetables Creamed or fried vegetables. Vegetables in a cheese sauce. Regular canned vegetables. Regular canned tomato sauce and paste. Regular tomato and vegetable juices. Fruits Dried fruits. Canned fruit in light or heavy syrup. Fruit juice. Meat and Other Protein Products Fatty cuts of meat. Ribs, chicken wings, bacon, sausage, bologna, salami, chitterlings,  fatback, hot dogs, bratwurst, and packaged luncheon meats. Salted nuts and seeds. Canned beans with salt. Dairy Whole or 2% milk,  cream, half-and-half, and cream cheese. Whole-fat or sweetened yogurt. Full-fat cheeses or blue cheese. Nondairy creamers and whipped toppings. Processed cheese, cheese spreads, or cheese curds. Condiments Onion and garlic salt, seasoned salt, table salt, and sea salt. Canned and packaged gravies. Worcestershire sauce. Tartar sauce. Barbecue sauce. Teriyaki sauce. Soy sauce, including reduced sodium. Steak sauce. Fish sauce. Oyster sauce. Cocktail sauce. Horseradish. Ketchup and mustard. Meat flavorings and tenderizers. Bouillon cubes. Hot sauce. Tabasco sauce. Marinades. Taco seasonings. Relishes. Fats and Oils Butter, stick margarine, lard, shortening, ghee, and bacon fat. Coconut, palm kernel, or palm oils. Regular salad dressings. Other Pickles and olives. Salted popcorn and pretzels. The items listed above may not be a complete list of foods and beverages to avoid. Contact your dietitian for more information. WHERE CAN I FIND MORE INFORMATION? National Heart, Lung, and Blood Institute: travelstabloid.com Document Released: 10/29/2011 Document Revised: 03/26/2014 Document Reviewed: 09/13/2013 [facility] Patient Information 2015 [place], [place]. This information is not intended to replace advice given to you by your health care provider. Make sure you discuss any questions you have with your health care provider.  Cholesterol Cholesterol is a white, waxy, fat-like substance needed by your body in small amounts. The liver makes all the cholesterol you need. Cholesterol is carried from the liver by the blood through the blood vessels. Deposits of cholesterol (plaque) may build up on blood vessel walls. These make the arteries narrower and stiffer. Cholesterol plaques increase the risk for heart attack and stroke.  You cannot feel your cholesterol level even if it is very high. The only way to know it is high is with a blood test. Once you know your cholesterol levels,  you should keep a record of the test results. Work with your health care provider to keep your levels in the desired range.  WHAT DO THE RESULTS MEAN?  Total cholesterol is a rough measure of all the cholesterol in your blood.   LDL is the so-called bad cholesterol. This is the type that deposits cholesterol in the walls of the arteries. You want this level to be low.   HDL is the good cholesterol because it cleans the arteries and carries the LDL away. You want this level to be high.  Triglycerides are fat that the body can either burn for energy or store. High levels are closely linked to heart disease.  WHAT ARE THE DESIRED LEVELS OF CHOLESTEROL?  Total cholesterol below 200.   LDL below 100 for people at risk, below 70 for those at very high risk.   HDL above 50 is good, above 60 is best.   Triglycerides below 150.  HOW CAN I LOWER MY CHOLESTEROL?  Diet. Follow your diet programs as directed by your health care provider.   Choose fish or white meat chicken and [place], roasted or baked. Limit fatty cuts of red meat, fried foods, and processed meats, such as sausage and lunch meats.   Eat lots of fresh fruits and vegetables.  Choose whole grains, beans, pasta, potatoes, and cereals.   Use only small amounts of olive, corn, or canola oils.   Avoid butter, mayonnaise, shortening, or palm kernel oils.  Avoid foods with trans fats.   Drink skim or nonfat milk and eat low-fat or nonfat yogurt and cheeses. Avoid whole milk, cream, ice cream, egg yolks, and full-fat cheeses.   Healthy desserts include angel  food cake, ginger snaps, animal crackers, hard candy, popsicles, and low-fat or nonfat frozen yogurt. Avoid pastries, cakes, pies, and cookies.   Exercise. Follow your exercise programs as directed by your health care provider.   A regular program helps decrease LDL and raise HDL.   A regular program helps with weight control.   Do things that increase your  activity level like gardening, walking, or taking the stairs. Ask your health care provider about how you can be more active in your daily life.   Medicine. Take medicine only as directed by your health care provider.   Medicine may be prescribed by your health care provider to help lower cholesterol and decrease the risk for heart disease.   If you have several risk factors, you may need medicine even if your levels are normal. Document Released: 08/04/2001 Document Revised: 03/26/2014 Document Reviewed: 08/23/2013 [facility] Patient Information 2015 [place], [place]. This information is not intended to replace advice given to you by your health care provider. Make sure you discuss any questions you have with your health care provider.

## 2014-07-31 NOTE — Progress Notes (Signed)
Patient ID: [name], female   DOB: 1966-07-28, 48 y.o.   MRN: [id number] KINSLEY HOLDERMAN [id number] August 10, 1966 07/31/2014      Progress Note-Follow Up  Subjective  Chief Complaint  Chief Complaint  Patient presents with  . Establish Care    new pt/ transfer from Dr Lenna Gilford  . Injections    flu    HPI  Patient is a 48 year old female in today for routine medical care. Patient is in today to establish care. She is in need of a primary care Dr. She follows with OB/GYN. Her major complaint today is of excessive fatigue. 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 No acute illness or other acute complaints noted. Denies CP/palp/SOB/HA/congestion/fevers/GI or GU c/o. Taking meds as prescribed  Past Medical History  Diagnosis Date  . Hypercholesteremia   . Hemorrhoids   . Migraine headache   . Anemia   . 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 Nasal congestion   . Anal bleeding   . Blood in stool   . Easy bruising   . Allergy     Past Surgical History  Procedure Laterality Date  . Myomectomy  04/2009    Dr. Dellis Filbert  . Eye surgery    . Hemorrhoid surgery  06/19/2012    Dr. Fanny Skates    Family History  Problem Relation Age of Onset  . Stroke Father   . Hypertension Father   . Cancer Mother 38    breast  . Heart disease Mother   . Hypertension Mother   . Hyperlipidemia Mother   . Heart attack Mother   . Stroke Mother   . Diabetes Mother     type 2  . Migraines Sister   . Allergies Sister     seasonal  . Congestive Heart Failure Maternal Grandmother   . Diabetes Maternal Grandmother     type 2  . Hypertension Maternal Grandmother     History   Social History  . Marital Status: Married    Spouse Name: spencer    Number of Children: 0  . 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  Smoking status: Never Smoker   . Smokeless tobacco: Never Used  . Alcohol Use: No     Comment: socially - sometimes  . Drug Use: No  . Sexual Activity: Yes    Birth Control/ Protection: IUD   Other Topics Concern  . Not on file   Social History Narrative  . No narrative on file    No current outpatient prescriptions on file prior to visit.   No current facility-administered medications on file prior to visit.    No Known Allergies  Review of Systems  Review of Systems  Constitutional: Negative for fever and malaise/fatigue.  HENT: Negative for congestion.   Eyes: Negative for discharge.  Respiratory: Negative for shortness of breath.   Cardiovascular: Negative for chest pain, palpitations and leg swelling.  Gastrointestinal: Negative for nausea, abdominal pain and diarrhea.  Genitourinary: Negative for dysuria.  Musculoskeletal: Negative for falls.  Skin: Negative for rash.  Neurological: Negative for loss of consciousness and headaches.  Endo/Heme/Allergies: Negative for polydipsia.  Psychiatric/Behavioral: Negative for depression and suicidal ideas. The patient is not nervous/anxious and does not have insomnia.  Objective  BP 132/75  Pulse 69  Temp(Src) 98.7 F (37.1 C) (Oral)  Ht 5' 4.5" (1.638 m)  Wt 182 lb 3.2 oz (82.645 kg)  BMI 30.80 kg/m2  SpO2 100%  Physical Exam  Physical Exam  Constitutional: She is oriented to person, place, and time and well-developed, well-nourished, and in no distress. No distress.  HENT:  Head: Normocephalic and atraumatic.  Right Ear: External ear normal.  Left Ear: External ear normal.  Nose: Nose normal.  Mouth/Throat: Oropharynx is clear and moist. No oropharyngeal exudate.  Eyes: Conjunctivae are normal. Pupils are equal, round, and reactive to light. Right eye exhibits no discharge. Left eye exhibits no discharge. No scleral icterus.  Neck: Normal range of motion. Neck supple. No thyromegaly present.   Cardiovascular: Normal rate, regular rhythm, normal heart sounds and intact distal pulses.   No murmur heard. Pulmonary/Chest: Effort normal and breath sounds normal. No respiratory distress. She has no wheezes. She has no rales.  Abdominal: Soft. Bowel sounds are normal. She exhibits no distension and no mass. There is no tenderness.  Musculoskeletal: Normal range of motion. She exhibits no edema and no tenderness.  Lymphadenopathy:    She has no cervical adenopathy.  Neurological: She is alert and oriented to person, place, and time. She has normal reflexes. No cranial nerve deficit. Coordination normal.  Skin: Skin is warm and dry. No rash noted. She is not diaphoretic.  Psychiatric: Mood, memory and affect normal.    Lab Results  Component Value Date   TSH 0.93 03/13/2013   Lab Results  Component Value Date   WBC 7.7 03/13/2013   HGB 12.5 03/13/2013   HCT 38.0 03/13/2013   MCV 89.3 03/13/2013   PLT 227.0 03/13/2013   Lab Results  Component Value Date   CREATININE 1.0 03/13/2013   BUN 10 03/13/2013   NA 139 03/13/2013   K 4.2 03/13/2013   CL 104 03/13/2013   CO2 27 03/13/2013   Lab Results  Component Value Date   ALT 17 03/13/2013   AST 21 03/13/2013   ALKPHOS 59 03/13/2013   BILITOT 0.8 03/13/2013   Lab Results  Component Value Date   CHOL 209* 03/13/2013   Lab Results  Component Value Date   HDL 46.00 03/13/2013   Lab Results  Component Value Date   LDLCALC 120* 01/17/2010   Lab Results  Component Value Date   TRIG 53.0 03/13/2013   Lab Results  Component Value Date   CHOLHDL 5 03/13/2013     Assessment & Plan   HYPERCHOLESTEROLEMIA Encouraged heart healthy diet, increase exercise, avoid trans fats, consider a krill oil cap daily  MIGRAINE HEADACHE Encouraged increased hydration, 64 ounces of clear fluids daily. Minimize alcohol and caffeine. Eat small frequent meals with lean proteins and complex carbs. Avoid high and low blood sugars. Get adequate sleep, 7-8  hours a night. Needs exercise daily preferably in the morning.  ANEMIA resolved  Annual physical exam Patient encouraged to maintain heart healthy diet, regular exercise, adequate sleep. Consider daily probiotics. Take medications as prescribed. Sees [facility], Dr Garwin Brothers. Fasting labs obtained today. No new concerns identifiied

## 2014-08-01 LAB — HEPATIC FUNCTION PANEL
ALT: 19 U/L (ref 0–35)
AST: 21 U/L (ref 0–37)
Albumin: 4.1 g/dL (ref 3.5–5.2)
Alkaline Phosphatase: 60 U/L (ref 39–117)
BILIRUBIN DIRECT: 0.1 mg/dL (ref 0.0–0.3)
TOTAL PROTEIN: 7.5 g/dL (ref 6.0–8.3)
Total Bilirubin: 0.4 mg/dL (ref 0.2–1.2)

## 2014-08-01 LAB — CBC
HCT: 36.2 % (ref 36.0–46.0)
Hemoglobin: 12.1 g/dL (ref 12.0–15.0)
MCHC: 33.3 g/dL (ref 30.0–36.0)
MCV: 88.6 fl (ref 78.0–100.0)
PLATELETS: 232 10*3/uL (ref 150.0–400.0)
RBC: 4.09 Mil/uL (ref 3.87–5.11)
RDW: 14 % (ref 11.5–15.5)
WBC: 9.5 10*3/uL (ref 4.0–10.5)

## 2014-08-01 LAB — LIPID PANEL
CHOL/HDL RATIO: 4
CHOLESTEROL: 199 mg/dL (ref 0–200)
HDL: 49.8 mg/dL (ref >39.00)
LDL Cholesterol: 134 mg/dL — ABNORMAL HIGH (ref 0–99)
NonHDL: 149.2
Triglycerides: 74 mg/dL (ref 0.0–149.0)
VLDL: 14.8 mg/dL (ref 0.0–40.0)

## 2014-08-01 LAB — RENAL FUNCTION PANEL
ALBUMIN: 4.1 g/dL (ref 3.5–5.2)
BUN: 11 mg/dL (ref 6–23)
CALCIUM: 9.2 mg/dL (ref 8.4–10.5)
CO2: 27 mEq/L (ref 19–32)
CREATININE: 0.9 mg/dL (ref 0.4–1.2)
Chloride: 103 mEq/L (ref 96–112)
GFR: 86.27 mL/min (ref >60.00)
GLUCOSE: 83 mg/dL (ref 70–99)
POTASSIUM: 3.8 meq/L (ref 3.5–5.1)
Phosphorus: 3.9 mg/dL (ref 2.3–4.6)
Sodium: 137 mEq/L (ref 135–145)

## 2014-08-01 LAB — TSH: TSH: 1 u[IU]/mL (ref 0.35–4.50)

## 2014-08-05 ENCOUNTER — Encounter: Payer: Self-pay | Admitting: Family Medicine

## 2014-08-05 DIAGNOSIS — Z0000 Encounter for general adult medical examination without abnormal findings: Secondary | ICD-10-CM | POA: Insufficient documentation

## 2014-08-05 DIAGNOSIS — R5381 Other malaise: Secondary | ICD-10-CM | POA: Insufficient documentation

## 2014-08-05 DIAGNOSIS — T7840XA Allergy, unspecified, initial encounter: Secondary | ICD-10-CM

## 2014-08-05 DIAGNOSIS — D509 Iron deficiency anemia, unspecified: Secondary | ICD-10-CM | POA: Insufficient documentation

## 2014-08-05 DIAGNOSIS — R5383 Other fatigue: Secondary | ICD-10-CM

## 2014-08-05 HISTORY — DX: Allergy, unspecified, initial encounter: T78.40XA

## 2014-08-05 NOTE — Assessment & Plan Note (Signed)
Patient encouraged to maintain heart healthy diet, regular exercise, adequate sleep. Consider daily probiotics. Take medications as prescribed. Sees [facility], Dr Garwin Brothers. Fasting labs obtained today. No new concerns identifiied

## 2014-08-05 NOTE — Assessment & Plan Note (Signed)
Encouraged heart healthy diet, increase exercise, avoid trans fats, consider a krill oil cap daily 

## 2014-08-05 NOTE — Assessment & Plan Note (Signed)
resolved 

## 2014-08-05 NOTE — Assessment & Plan Note (Signed)
Encouraged increased hydration, 64 ounces of clear fluids daily. Minimize alcohol and caffeine. Eat small frequent meals with lean proteins and complex carbs. Avoid high and low blood sugars. Get adequate sleep, 7-8 hours a night. Needs exercise daily preferably in the morning.  

## 2014-10-02 ENCOUNTER — Telehealth: Payer: Self-pay | Admitting: Family Medicine

## 2014-10-02 NOTE — Telephone Encounter (Signed)
If patient is requesting last physical note she needs to call medical records  Please call and inform pt per Dr Charlett Blake

## 2014-10-02 NOTE — Telephone Encounter (Signed)
Caller name: Joslynn, Jamroz Relation to pt: self  Call back number: [phone]   Reason for call: pt requesting last physical as well blood work please mail to home address

## 2014-10-16 NOTE — Telephone Encounter (Signed)
Pt called back following up, I  Transferred pt to medical records

## 2015-01-09 ENCOUNTER — Encounter: Payer: Self-pay | Admitting: Medical

## 2015-01-09 ENCOUNTER — Ambulatory Visit (INDEPENDENT_AMBULATORY_CARE_PROVIDER_SITE_OTHER): Payer: BC Managed Care – PPO | Admitting: Medical

## 2015-01-09 VITALS — BP 132/85 | HR 77 | Temp 98.7°F | Ht 64.5 in | Wt 185.2 lb

## 2015-01-09 DIAGNOSIS — R197 Diarrhea, unspecified: Secondary | ICD-10-CM

## 2015-01-09 DIAGNOSIS — K5900 Constipation, unspecified: Secondary | ICD-10-CM | POA: Insufficient documentation

## 2015-01-09 HISTORY — DX: Constipation, unspecified: K59.00

## 2015-01-09 MED ORDER — METRONIDAZOLE 500 MG PO TABS: 500.0000 mg | ORAL_TABLET | Freq: Three times a day (TID) | ORAL | Status: DC

## 2015-01-09 NOTE — Progress Notes (Signed)
Subjective:    Patient ID: [name], female    DOB: May 12, 1966, 49 y.o.   MRN: [id number]  HPI   Pt in with close friend diagnosed with C dif. Pt friend was diagnosed for a while. Pt friend had kidney transplant and having hard time getting over this.  Pt had close contact with friend and visited her this weekend.  Pt had some diarrhea on Sunday and Monday. Fever on Monday but none since.   Pt had loose stools on Sunday Maybe 20 times watery loose stool. Monday 5 watery loose stools(more solid). No bm since Tuesday..  Pt in today reporting  diarrhea for Sunday and monday. On review no  association with with any foods or meal that immediately preceded onset of diarrhea. Report contact with friend who had c dif.  Recent no antibiotics. Reports no history of an inflammatory bowel diseases.  Vomited twice on Sunday.  Pt has tried otc treatments.   LMP- mirena. Last Saturday.   Review of Systems  Constitutional: Negative for fever, chills and fatigue.  Respiratory: Negative for cough, choking and wheezing.   Gastrointestinal: Positive for nausea, vomiting and diarrhea. Negative for abdominal pain and constipation.       See hop.  Genitourinary: Negative.   Musculoskeletal: Negative for back pain.  Hematological: Negative for adenopathy. Does not bruise/bleed easily.  Psychiatric/Behavioral: Negative for behavioral problems and confusion.   Past Medical History  Diagnosis Date  . Hypercholesteremia   . Hemorrhoids   . Migraine headache   . Anemia   . 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 Nasal congestion   . Anal bleeding   . Blood in stool   . Easy bruising   . Allergy   . Allergic state 08/05/2014    History   Social History  . Marital Status: Married    Spouse Name: spencer  . Number of Children: 0  . 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  Smoking status: Never Smoker   . Smokeless tobacco: Never Used  . Alcohol Use: No     Comment: socially - sometimes  . Drug Use: No  . 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 Not on file   Social History Narrative    Past Surgical History  Procedure Laterality Date  . Myomectomy  04/2009    Dr. Dellis Filbert  . Hemorrhoid surgery  06/19/2012    Dr. Fanny Skates  . Eye surgery Bilateral     lasik    Family History  Problem Relation Age of Onset  . Stroke Father   . Hypertension Father   . Cancer Mother 75    breast  . Heart disease Mother   . Hypertension Mother   . Hyperlipidemia Mother   . Heart attack Mother   . Stroke Mother   . Diabetes Mother     type 2  . Migraines Sister   . Allergies Sister     seasonal  . Congestive Heart Failure Maternal Grandmother   . Diabetes Maternal Grandmother     type 2  . Hypertension Maternal Grandmother     No Known Allergies  Current Outpatient Prescriptions on File Prior to Visit  Medication Sig Dispense Refill  . cetirizine (ZYRTEC) 10 MG tablet Take 10 mg by mouth daily.     No current facility-administered medications on file prior to visit.    BP 132/85 mmHg  Pulse 77  Temp(Src) 98.7 F (37.1 C) (Oral)  Ht 5' 4.5" (1.638 m)  Wt 185 lb 3.2 oz (84.006 kg)  BMI 31.31 kg/m2  SpO2 98%       Objective:   Physical Exam  General Appearance- Not in acute distress.  HEENT Eyes- Scleraeral/Conjuntiva-bilat- Not Yellow. Mouth & Throat- Normal.  Chest and Lung Exam Auscultation: Breath sounds:-Normal. Adventitious sounds:- No Adventitious sounds.  Cardiovascular Auscultation:Rythm - Regular. Heart Sounds -Normal heart sounds.  Abdomen Inspection:-Inspection Normal.  Palpation/Perucssion: Palpation and Percussion of the abdomen reveal- Non Tender, No Rebound tenderness, No  rigidity(Guarding) and No Palpable abdominal masses.  Liver:-Normal.  Spleen:- Normal.   Skin- moist..      Assessment & Plan:

## 2015-01-09 NOTE — Patient Instructions (Addendum)
Diarrhea You may  viral gastroenteritis but some exposure to c dif. I want you to rest, hydrate, follow bland diet guidlines and take tylenol for fever.  Take imodium otc for diarrhea. Your nausea ad vomiting has resolved but if returns would rx zofran.  There is some chance that your have a bacterial infection so if your loose stools/diarrhea returns, I  do want you to get stool panel kit and turn that in as soon as possible. Turning stool panel kit earlier will provide [place] with quicker result of studies and more informed decision if antibiotics are needed.  In light of your exposure to c dif, if diarrhea return also start flagyl pending study results.

## 2015-01-09 NOTE — Assessment & Plan Note (Signed)
You may  viral gastroenteritis but some exposure to c dif. I want you to rest, hydrate, follow bland diet guidlines and take tylenol for fever.  Take imodium otc for diarrhea. Your nausea ad vomiting has resolved but if returns would rx zofran.  There is some chance that your have a bacterial infection so if your loose stools/diarrhea returns, I  do want you to get stool panel kit and turn that in as soon as possible. Turning stool panel kit earlier will provide [place] with quicker result of studies and more informed decision if antibiotics are needed.  In light of your exposure to c dif, if diarrhea return also start flagyl pending study results.

## 2015-01-09 NOTE — Progress Notes (Signed)
Pre visit review using our clinic review tool, if applicable. No additional management support is needed unless otherwise documented below in the visit note. 

## 2015-02-04 ENCOUNTER — Encounter: Payer: Self-pay | Admitting: Family Medicine

## 2015-02-04 ENCOUNTER — Ambulatory Visit (INDEPENDENT_AMBULATORY_CARE_PROVIDER_SITE_OTHER): Payer: BC Managed Care – PPO | Admitting: Family Medicine

## 2015-02-04 VITALS — BP 130/80 | HR 79 | Temp 98.6°F | Ht 63.5 in | Wt 184.4 lb

## 2015-02-04 DIAGNOSIS — G43809 Other migraine, not intractable, without status migrainosus: Secondary | ICD-10-CM

## 2015-02-04 DIAGNOSIS — T7840XD Allergy, unspecified, subsequent encounter: Secondary | ICD-10-CM

## 2015-02-04 MED ORDER — SUMATRIPTAN SUCCINATE 100 MG PO TABS: 100.0000 mg | ORAL_TABLET | ORAL | Status: DC | PRN

## 2015-02-04 MED ORDER — FLUTICASONE PROPIONATE 50 MCG/ACT NA SUSP: 2.0000 | Freq: Every day | NASAL | Status: DC

## 2015-02-04 MED ORDER — CETIRIZINE HCL 10 MG PO TABS: 10.0000 mg | ORAL_TABLET | Freq: Two times a day (BID) | ORAL | Status: DC | PRN

## 2015-02-04 MED ORDER — CETIRIZINE HCL 10 MG PO TABS: 10.0000 mg | ORAL_TABLET | Freq: Every day | ORAL | Status: DC

## 2015-02-04 NOTE — Patient Instructions (Addendum)
Aleve/Naproxen=Advil/Motrin/Ibuprofen=Aspirin/ASA=NSADs  Tylenol/Acetaminophen/APA  Can mix Imitrex with any NSAID or Tylenol   Can take a max of 3000 mg of Tylenol in 24 hours (ES tab=500 mg so can take 2 tabs 3 x a day)  For the allergies can consider Zyrtec 10 mg twice daily when allergies or headaches flare Flonase daily  Migraine Headache A migraine headache is an intense, throbbing pain on one or both sides of your head. A migraine can last for 30 minutes to several hours. CAUSES  The exact cause of a migraine headache is not always known. However, a migraine may be caused when nerves in the brain become irritated and release chemicals that cause inflammation. This causes pain. Certain things may also trigger migraines, such as:  Alcohol.  Smoking.  Stress.  Menstruation.  Aged cheeses.  Foods or drinks that contain nitrates, glutamate, aspartame, or tyramine.  Lack of sleep.  Chocolate.  Caffeine.  Hunger.  Physical exertion.  Fatigue.  Medicines used to treat chest pain (nitroglycerine), birth control pills, estrogen, and some blood pressure medicines. SIGNS AND SYMPTOMS  Pain on one or both sides of your head.  Pulsating or throbbing pain.  Severe pain that prevents daily activities.  Pain that is aggravated by any physical activity.  Nausea, vomiting, or both.  Dizziness.  Pain with exposure to bright lights, loud noises, or activity.  General sensitivity to bright lights, loud noises, or smells. Before you get a migraine, you may get warning signs that a migraine is coming (aura). An aura may include:  Seeing flashing lights.  Seeing bright spots, halos, or zigzag lines.  Having tunnel vision or blurred vision.  Having feelings of numbness or tingling.  Having trouble talking.  Having muscle weakness. DIAGNOSIS  A migraine headache is often diagnosed based on:  Symptoms.  Physical exam.  A CT scan or MRI of your head. These  imaging tests cannot diagnose migraines, but they can help rule out other causes of headaches. TREATMENT Medicines may be given for pain and nausea. Medicines can also be given to help prevent recurrent migraines.  HOME CARE INSTRUCTIONS  Only take over-the-counter or prescription medicines for pain or discomfort as directed by your health care provider. The use of long-term narcotics is not recommended.  Lie down in a dark, quiet room when you have a migraine.  Keep a journal to find out what may trigger your migraine headaches. For example, write down:  What you eat and drink.  How much sleep you get.  Any change to your diet or medicines.  Limit alcohol consumption.  Quit smoking if you smoke.  Get 7-9 hours of sleep, or as recommended by your health care provider.  Limit stress.  Keep lights dim if bright lights bother you and make your migraines worse. SEEK IMMEDIATE MEDICAL CARE IF:   Your migraine becomes severe.  You have a fever.  You have a stiff neck.  You have vision loss.  You have muscular weakness or loss of muscle control.  You start losing your balance or have trouble walking.  You feel faint or pass out.  You have severe symptoms that are different from your first symptoms. MAKE SURE YOU:   Understand these instructions.  Will watch your condition.  Will get help right away if you are not doing well or get worse. Document Released: 11/09/2005 Document Revised: 03/26/2014 Document Reviewed: 07/17/2013 [facility] Patient Information 2015 [place], [place]. This information is not intended to replace advice given to you by  your health care provider. Make sure you discuss any questions you have with your health care provider.  

## 2015-02-10 NOTE — Assessment & Plan Note (Signed)
Encouraged increased hydration, 64 ounces of clear fluids daily. Minimize alcohol and caffeine. Eat small frequent meals with lean proteins and complex carbs. Avoid high and low blood sugars. Get adequate sleep, 7-8 hours a night. Needs exercise daily preferably in the morning. Will try a course of Imitrex, warned about possible side effects

## 2015-02-10 NOTE — Progress Notes (Signed)
[name]  [id number] October 17, 1966 02/10/2015      Progress Note-Follow Up  Subjective  Chief Complaint  Chief Complaint  Patient presents with  . Headache    HPI  Patient is a 49 y.o. female in today for routine medical care. Patient in for follow-up on headaches. Had a recent migraine. It has broken but the intensity of the migraine and the increasing frequency of her migraines has caused her to return. No headache today. She technologist struggling some with dehydration and occasionally skips meals. Has also had a flare in her allergies with nasal congestion but no fevers or chills. Denies CP/palp/SOB/HA/congestion/fevers/GI or GU c/o. Taking meds as prescribed  Past Medical History  Diagnosis Date  . Hypercholesteremia   . Hemorrhoids   . Migraine headache   . Anemia   . 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 Nasal congestion   . Anal bleeding   . Blood in stool   . Easy bruising   . Allergy   . Allergic state 08/05/2014    Past Surgical History  Procedure Laterality Date  . Myomectomy  04/2009    Dr. Dellis Filbert  . Hemorrhoid surgery  06/19/2012    Dr. Fanny Skates  . Eye surgery Bilateral     lasik    Family History  Problem Relation Age of Onset  . Stroke Father   . Hypertension Father   . Cancer Mother 48    breast  . Heart disease Mother   . Hypertension Mother   . Hyperlipidemia Mother   . Heart attack Mother   . Stroke Mother   . Diabetes Mother     type 2  . Migraines Sister   . Allergies Sister     seasonal  . Congestive Heart Failure Maternal Grandmother   . Diabetes Maternal Grandmother     type 2  . Hypertension Maternal Grandmother     History   Social History  . Marital Status: Married    Spouse Name: spencer  . Number of Children: 0  . 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 Smoking status: Never Smoker   .  Smokeless tobacco: Never Used  . Alcohol Use: No     Comment: socially - sometimes  . Drug Use: No  . 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 Not on file   Social History Narrative    Current Outpatient Prescriptions on File Prior to Visit  Medication Sig Dispense Refill  . Probiotic Product (SOLUBLE FIBER/PROBIOTICS PO) Take by mouth.     No current facility-administered medications on file prior to visit.    No Known Allergies  Review of Systems  Review of Systems  Constitutional: Negative for fever and malaise/fatigue.  HENT: Negative for congestion.   Eyes: Negative for discharge.  Respiratory: Negative for shortness of breath.   Cardiovascular: Negative for chest pain, palpitations and leg swelling.  Gastrointestinal: Negative for nausea, abdominal pain and diarrhea.  Genitourinary: Negative for dysuria.  Musculoskeletal: Negative for falls.  Skin: Negative for rash.  Neurological: Negative for loss of consciousness and headaches.  Endo/Heme/Allergies: Negative for polydipsia.  Psychiatric/Behavioral: Negative for depression and suicidal ideas. The patient is not nervous/anxious  and does not have insomnia.     Objective  BP 130/80 mmHg  Pulse 79  Temp(Src) 98.6 F (37 C) (Oral)  Ht 5' 3.5" (1.613 m)  Wt 184 lb 6 oz (83.632 kg)  BMI 32.14 kg/m2  SpO2 97%  Physical Exam  Physical Exam  Constitutional: She is oriented to person, place, and time and well-developed, well-nourished, and in no distress. No distress.  HENT:  Head: Normocephalic and atraumatic.  Eyes: Conjunctivae are normal.  Neck: Neck supple. No thyromegaly present.  Cardiovascular: Normal rate, regular rhythm and normal heart sounds.   No murmur heard. Pulmonary/Chest: Effort normal and breath sounds normal. She has no wheezes.  Abdominal: She exhibits no distension  and no mass.  Musculoskeletal: She exhibits no edema.  Lymphadenopathy:    She has no cervical adenopathy.  Neurological: She is alert and oriented to person, place, and time.  Skin: Skin is warm and dry. No rash noted. She is not diaphoretic.  Psychiatric: Memory, affect and judgment normal.    Lab Results  Component Value Date   TSH 1.00 07/31/2014   Lab Results  Component Value Date   WBC 9.5 07/31/2014   HGB 12.1 07/31/2014   HCT 36.2 07/31/2014   MCV 88.6 07/31/2014   PLT 232.0 07/31/2014   Lab Results  Component Value Date   CREATININE 0.9 07/31/2014   BUN 11 07/31/2014   NA 137 07/31/2014   K 3.8 07/31/2014   CL 103 07/31/2014   CO2 27 07/31/2014   Lab Results  Component Value Date   ALT 19 07/31/2014   AST 21 07/31/2014   ALKPHOS 60 07/31/2014   BILITOT 0.4 07/31/2014   Lab Results  Component Value Date   CHOL 199 07/31/2014   Lab Results  Component Value Date   HDL 49.80 07/31/2014   Lab Results  Component Value Date   LDLCALC 134* 07/31/2014   Lab Results  Component Value Date   TRIG 74.0 07/31/2014   Lab Results  Component Value Date   CHOLHDL 4 07/31/2014     Assessment & Plan  Allergic state Encouraged adequate hydration, daily antihistamines and nasal steroids.   Migraine Encouraged increased hydration, 64 ounces of clear fluids daily. Minimize alcohol and caffeine. Eat small frequent meals with lean proteins and complex carbs. Avoid high and low blood sugars. Get adequate sleep, 7-8 hours a night. Needs exercise daily preferably in the morning. Will try a course of Imitrex, warned about possible side effects

## 2015-02-10 NOTE — Assessment & Plan Note (Signed)
Encouraged adequate hydration, daily antihistamines and nasal steroids.

## 2015-02-26 ENCOUNTER — Other Ambulatory Visit: Payer: Self-pay

## 2015-02-26 DIAGNOSIS — Z1231 Encounter for screening mammogram for malignant neoplasm of breast: Secondary | ICD-10-CM

## 2015-03-18 ENCOUNTER — Ambulatory Visit: Payer: BC Managed Care – PPO

## 2015-03-19 ENCOUNTER — Other Ambulatory Visit: Payer: Self-pay | Admitting: Obstetrics and Gynecology

## 2015-03-19 ENCOUNTER — Ambulatory Visit
Admission: RE | Admit: 2015-03-19 | Discharge: 2015-03-19 | Disposition: A | Discharge status: Home / Self Care | Payer: BC Managed Care – PPO | Source: Ambulatory Visit

## 2015-03-19 DIAGNOSIS — R928 Other abnormal and inconclusive findings on diagnostic imaging of breast: Secondary | ICD-10-CM

## 2015-03-19 DIAGNOSIS — Z1231 Encounter for screening mammogram for malignant neoplasm of breast: Secondary | ICD-10-CM

## 2015-03-25 ENCOUNTER — Ambulatory Visit
Admission: RE | Admit: 2015-03-25 | Discharge: 2015-03-25 | Disposition: A | Discharge status: Home / Self Care | Payer: BC Managed Care – PPO | Source: Ambulatory Visit | Attending: Obstetrics and Gynecology | Admitting: Obstetrics and Gynecology

## 2015-03-25 DIAGNOSIS — R928 Other abnormal and inconclusive findings on diagnostic imaging of breast: Secondary | ICD-10-CM

## 2015-05-09 ENCOUNTER — Encounter: Payer: Self-pay | Admitting: Family Medicine

## 2015-05-09 ENCOUNTER — Ambulatory Visit (INDEPENDENT_AMBULATORY_CARE_PROVIDER_SITE_OTHER): Payer: BC Managed Care – PPO | Admitting: Family Medicine

## 2015-05-09 VITALS — BP 122/80 | HR 98 | Temp 97.9°F | Ht 64.0 in | Wt 188.1 lb

## 2015-05-09 DIAGNOSIS — D649 Anemia, unspecified: Secondary | ICD-10-CM

## 2015-05-09 DIAGNOSIS — T7840XD Allergy, unspecified, subsequent encounter: Secondary | ICD-10-CM

## 2015-05-09 DIAGNOSIS — E7800 Pure hypercholesterolemia, unspecified: Secondary | ICD-10-CM

## 2015-05-09 DIAGNOSIS — G43809 Other migraine, not intractable, without status migrainosus: Secondary | ICD-10-CM

## 2015-05-09 DIAGNOSIS — E782 Mixed hyperlipidemia: Secondary | ICD-10-CM | POA: Diagnosis not present

## 2015-05-09 DIAGNOSIS — Z0000 Encounter for general adult medical examination without abnormal findings: Secondary | ICD-10-CM

## 2015-05-09 NOTE — Patient Instructions (Signed)

## 2015-05-09 NOTE — Progress Notes (Signed)
Pre visit review using our clinic review tool, if applicable. No additional management support is needed unless otherwise documented below in the visit note. 

## 2015-05-12 NOTE — Progress Notes (Signed)
[name]  [id number] 09/22/1966 05/12/2015      Progress Note-Follow Up  Subjective  Chief Complaint  Chief Complaint  Patient presents with  . Follow-up    HPI  Patient is a 49 y.o. female in today for routine medical care. Patient is in today for routine follow-up and is generally feeling well. No cheek continues to have migraines intermittently but they're usually only associated with her cycles now. Imitrex is helpful when she needs it. Allergies did flare over the spring and medications were helpful. She has been using them as needed. No recent fever or acute illness. Denies CP/palp/SOB/HA/congestion/fevers/GI or GU c/o. Taking meds as prescribed  Past Medical History  Diagnosis Date  . Hypercholesteremia   . Hemorrhoids   . Migraine headache   . Anemia   . 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 Nasal congestion   . Anal bleeding   . Blood in stool   . Easy bruising   . Allergy   . Allergic state 08/05/2014    Past Surgical History  Procedure Laterality Date  . Myomectomy  04/2009    Dr. Dellis Filbert  . Hemorrhoid surgery  06/19/2012    Dr. Fanny Skates  . Eye surgery Bilateral     lasik    Family History  Problem Relation Age of Onset  . Stroke Father   . Hypertension Father   . Cancer Mother 90    breast  . Heart disease Mother   . Hypertension Mother   . Hyperlipidemia Mother   . Heart attack Mother   . Stroke Mother   . Diabetes Mother     type 2  . Migraines Sister   . Allergies Sister     seasonal  . Congestive Heart Failure Maternal Grandmother   . Diabetes Maternal Grandmother     type 2  . Hypertension Maternal Grandmother     History   Social History  . Marital Status: Married    Spouse Name: spencer  . Number of Children: 0  . 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 Smoking status: Never Smoker   . Smokeless  tobacco: Never Used  . Alcohol Use: No     Comment: socially - sometimes  . Drug Use: No  . 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 Not on file   Social History Narrative    Current Outpatient Prescriptions on File Prior to Visit  Medication Sig Dispense Refill  . cetirizine (ZYRTEC) 10 MG tablet Take 1 tablet (10 mg total) by mouth 2 (two) times daily as needed for allergies or rhinitis. 100 tablet 6  . fluticasone (FLONASE) 50 MCG/ACT nasal spray Place 2 sprays into both nostrils daily. 16 g 6  . Probiotic Product (SOLUBLE FIBER/PROBIOTICS PO) Take by mouth.    . SUMAtriptan (IMITREX) 100 MG tablet Take 1 tablet (100 mg total) by mouth every 2 (two) hours as needed for migraine. May repeat in 2 hours if headache persists or recurs. (Patient not taking: Reported on 05/09/2015) 10 tablet 0   No current facility-administered medications on file prior to visit.    No Known Allergies  Review of Systems  Review of Systems  Constitutional: Negative for fever and malaise/fatigue.  HENT: Negative for congestion.   Eyes: Negative for discharge.  Respiratory: Negative for shortness of breath.   Cardiovascular: Negative for chest pain, palpitations and leg swelling.  Gastrointestinal: Negative for nausea, abdominal pain and diarrhea.  Genitourinary: Negative for dysuria.  Musculoskeletal: Negative for falls.  Skin: Negative for rash.  Neurological: Positive for headaches. Negative for loss of consciousness.  Endo/Heme/Allergies: Negative for polydipsia.  Psychiatric/Behavioral: Negative for depression and suicidal ideas. The patient is not nervous/anxious and does not have insomnia.     Objective  BP 122/80 mmHg  Pulse 98  Temp(Src) 97.9 F (36.6 C) (Oral)  Ht 5\' 4"  (1.626 m)  Wt 188 lb 2 oz (85.333 kg)  BMI 32.28 kg/m2  SpO2 99%  Physical  Exam  Physical Exam  Constitutional: She is oriented to person, place, and time and well-developed, well-nourished, and in no distress. No distress.  HENT:  Head: Normocephalic and atraumatic.  Eyes: Conjunctivae are normal.  Neck: Neck supple. No thyromegaly present.  Cardiovascular: Normal rate, regular rhythm and normal heart sounds.   No murmur heard. Pulmonary/Chest: Effort normal and breath sounds normal. She has no wheezes.  Abdominal: She exhibits no distension and no mass.  Musculoskeletal: She exhibits no edema.  Lymphadenopathy:    She has no cervical adenopathy.  Neurological: She is alert and oriented to person, place, and time.  Skin: Skin is warm and dry. No rash noted. She is not diaphoretic.  Psychiatric: Memory, affect and judgment normal.    Lab Results  Component Value Date   TSH 1.00 07/31/2014   Lab Results  Component Value Date   WBC 9.5 07/31/2014   HGB 12.1 07/31/2014   HCT 36.2 07/31/2014   MCV 88.6 07/31/2014   PLT 232.0 07/31/2014   Lab Results  Component Value Date   CREATININE 0.9 07/31/2014   BUN 11 07/31/2014   NA 137 07/31/2014   K 3.8 07/31/2014   CL 103 07/31/2014   CO2 27 07/31/2014   Lab Results  Component Value Date   ALT 19 07/31/2014   AST 21 07/31/2014   ALKPHOS 60 07/31/2014   BILITOT 0.4 07/31/2014   Lab Results  Component Value Date   CHOL 199 07/31/2014   Lab Results  Component Value Date   HDL 49.80 07/31/2014   Lab Results  Component Value Date   LDLCALC 134* 07/31/2014   Lab Results  Component Value Date   TRIG 74.0 07/31/2014   Lab Results  Component Value Date   CHOLHDL 4 07/31/2014     Assessment & Plan  Migraine Encouraged increased hydration, 64 ounces of clear fluids daily. Minimize alcohol and caffeine. Eat small frequent meals with lean proteins and complex carbs. Avoid high and low blood sugars. Get adequate sleep, 7-8 hours a night. Needs exercise daily preferably in the  morning.  HYPERCHOLESTEROLEMIA Encouraged heart healthy diet, increase exercise, avoid trans fats, consider a krill oil cap daily  Allergic state Encouraged Zyrtec, Flonase and nasal saline as needed

## 2015-05-12 NOTE — Assessment & Plan Note (Signed)
Encouraged Zyrtec, Flonase and nasal saline as needed

## 2015-05-12 NOTE — Assessment & Plan Note (Signed)
Encouraged increased hydration, 64 ounces of clear fluids daily. Minimize alcohol and caffeine. Eat small frequent meals with lean proteins and complex carbs. Avoid high and low blood sugars. Get adequate sleep, 7-8 hours a night. Needs exercise daily preferably in the morning.  

## 2015-05-12 NOTE — Assessment & Plan Note (Signed)
Encouraged heart healthy diet, increase exercise, avoid trans fats, consider a krill oil cap daily 

## 2015-10-24 LAB — HM PAP SMEAR: HM Pap smear: NORMAL

## 2015-10-25 ENCOUNTER — Other Ambulatory Visit: Payer: Self-pay | Admitting: Obstetrics and Gynecology

## 2015-11-05 ENCOUNTER — Other Ambulatory Visit: Payer: BC Managed Care – PPO

## 2015-11-06 ENCOUNTER — Encounter (HOSPITAL_COMMUNITY): Payer: Self-pay | Admitting: Anesthesiology

## 2015-11-06 ENCOUNTER — Encounter (HOSPITAL_COMMUNITY)
Admission: RE | Admit: 2015-11-06 | Discharge: 2015-11-06 | Disposition: A | Discharge status: Home / Self Care | Payer: BC Managed Care – PPO | Source: Ambulatory Visit | Attending: Obstetrics and Gynecology | Admitting: Obstetrics and Gynecology

## 2015-11-06 ENCOUNTER — Encounter (HOSPITAL_COMMUNITY): Payer: Self-pay

## 2015-11-06 DIAGNOSIS — D509 Iron deficiency anemia, unspecified: Secondary | ICD-10-CM | POA: Diagnosis not present

## 2015-11-06 DIAGNOSIS — N938 Other specified abnormal uterine and vaginal bleeding: Secondary | ICD-10-CM | POA: Diagnosis present

## 2015-11-06 DIAGNOSIS — K219 Gastro-esophageal reflux disease without esophagitis: Secondary | ICD-10-CM | POA: Diagnosis not present

## 2015-11-06 DIAGNOSIS — D259 Leiomyoma of uterus, unspecified: Secondary | ICD-10-CM | POA: Diagnosis not present

## 2015-11-06 HISTORY — DX: Gastro-esophageal reflux disease without esophagitis: K21.9

## 2015-11-06 LAB — BASIC METABOLIC PANEL
ANION GAP: 8 (ref 5–15)
BUN: 8 mg/dL (ref 6–20)
CALCIUM: 8.9 mg/dL (ref 8.9–10.3)
CO2: 23 mmol/L (ref 22–32)
CREATININE: 0.98 mg/dL (ref 0.44–1.00)
Chloride: 106 mmol/L (ref 101–111)
Glucose, Bld: 130 mg/dL — ABNORMAL HIGH (ref 65–99)
Potassium: 3.3 mmol/L — ABNORMAL LOW (ref 3.5–5.1)
SODIUM: 137 mmol/L (ref 135–145)

## 2015-11-06 LAB — CBC
HCT: 33.5 % — ABNORMAL LOW (ref 36.0–46.0)
Hemoglobin: 10.6 g/dL — ABNORMAL LOW (ref 12.0–15.0)
MCH: 27.7 pg (ref 26.0–34.0)
MCHC: 31.6 g/dL (ref 30.0–36.0)
MCV: 87.7 fL (ref 78.0–100.0)
PLATELETS: 266 10*3/uL (ref 150–400)
RBC: 3.82 MIL/uL — AB (ref 3.87–5.11)
RDW: 13.2 % (ref 11.5–15.5)
WBC: 6.5 10*3/uL (ref 4.0–10.5)

## 2015-11-06 NOTE — Anesthesia Preprocedure Evaluation (Addendum)
Anesthesia Evaluation  Patient identified by MRN, date of birth, ID band Patient awake    Reviewed: Allergy & Precautions, NPO status , Patient's Chart, lab work & pertinent test results  Airway Mallampati: III  TM Distance: >3 FB Neck ROM: Full    Dental no notable dental hx. (+) Teeth Intact   Pulmonary neg pulmonary ROS,    breath sounds clear to auscultation       Cardiovascular negative cardio ROS Normal cardiovascular exam Rhythm:Regular Rate:Normal     Neuro/Psych  Headaches, negative neurological ROS  negative psych ROS   GI/Hepatic Neg liver ROS, GERD  Medicated and Controlled,  Endo/Other  Obesity Hypercholesterolemia  Renal/GU negative Renal ROS  negative genitourinary   Musculoskeletal negative musculoskeletal ROS (+)   Abdominal (+) + obese,   Peds  Hematology  (+) anemia ,   Anesthesia Other Findings   Reproductive/Obstetrics Uterine Fibroids DUB                            Anesthesia Physical Anesthesia Plan  ASA: II  Anesthesia Plan: General   Post-op Pain Management:    Induction: Intravenous  Airway Management Planned: Oral ETT  Additional Equipment:   Intra-op Plan:   Post-operative Plan: Extubation in OR  Informed Consent: I have reviewed the patients History and Physical, chart, labs and discussed the procedure including the risks, benefits and alternatives for the proposed anesthesia with the patient or authorized representative who has indicated his/her understanding and acceptance.   Dental advisory given  Plan Discussed with: CRNA, Anesthesiologist and Surgeon  Anesthesia Plan Comments:         Anesthesia Quick Evaluation

## 2015-11-06 NOTE — Patient Instructions (Addendum)
Your procedure is scheduled on:11/06/16  Enter through the Main Entrance at :6am Pick up desk phone and dial [phone] and inform us of your arrival.  Please call [phone] if you have any problems the morning of surgery.  Remember: Do not eat food or drink liquids, including water, after midnight:tonight    You may brush your teeth the morning of surgery.    DO NOT wear jewelry, eye make-up, lipstick,body lotion, or dark fingernail polish.  (Polished toes are ok) You may wear deodorant.  If you are to be admitted after surgery, leave suitcase in car until your room has been assigned. Patients discharged on the day of surgery will not be allowed to drive home. Wear loose fitting, comfortable clothes for your ride home.

## 2015-11-07 ENCOUNTER — Ambulatory Visit (HOSPITAL_COMMUNITY)
Admission: RE | Admit: 2015-11-07 | Discharge: 2015-11-08 | Disposition: A | Discharge status: Home / Self Care | Payer: BC Managed Care – PPO | Source: Ambulatory Visit | Attending: Obstetrics and Gynecology | Admitting: Obstetrics and Gynecology

## 2015-11-07 ENCOUNTER — Ambulatory Visit (HOSPITAL_COMMUNITY): Payer: BC Managed Care – PPO | Admitting: Anesthesiology

## 2015-11-07 ENCOUNTER — Encounter (HOSPITAL_COMMUNITY)
Admission: RE | Disposition: A | Discharge status: Home / Self Care | Payer: Self-pay | Source: Ambulatory Visit | Attending: Obstetrics and Gynecology

## 2015-11-07 DIAGNOSIS — D509 Iron deficiency anemia, unspecified: Secondary | ICD-10-CM | POA: Insufficient documentation

## 2015-11-07 DIAGNOSIS — D649 Anemia, unspecified: Secondary | ICD-10-CM

## 2015-11-07 DIAGNOSIS — N938 Other specified abnormal uterine and vaginal bleeding: Secondary | ICD-10-CM | POA: Diagnosis not present

## 2015-11-07 DIAGNOSIS — K219 Gastro-esophageal reflux disease without esophagitis: Secondary | ICD-10-CM | POA: Insufficient documentation

## 2015-11-07 DIAGNOSIS — Z90710 Acquired absence of both cervix and uterus: Secondary | ICD-10-CM | POA: Diagnosis present

## 2015-11-07 DIAGNOSIS — D259 Leiomyoma of uterus, unspecified: Secondary | ICD-10-CM | POA: Insufficient documentation

## 2015-11-07 HISTORY — PX: ROBOTIC ASSISTED TOTAL HYSTERECTOMY WITH SALPINGECTOMY: SHX6679

## 2015-11-07 LAB — TYPE AND SCREEN
ABO/RH(D): A POS
Antibody Screen: NEGATIVE

## 2015-11-07 LAB — ABO/RH: ABO/RH(D): A POS

## 2015-11-07 LAB — PREGNANCY, URINE: PREG TEST UR: NEGATIVE

## 2015-11-07 SURGERY — ROBOTIC ASSISTED TOTAL HYSTERECTOMY WITH SALPINGECTOMY
Anesthesia: General | Site: Abdomen | Laterality: Bilateral

## 2015-11-07 MED ORDER — DEXTROSE IN LACTATED RINGERS 5 % IV SOLN
INTRAVENOUS | Status: DC
  Administered 2015-11-07: 18:00:00 via INTRAVENOUS

## 2015-11-07 MED ORDER — GLYCOPYRROLATE 0.2 MG/ML IJ SOLN
INTRAMUSCULAR | Status: AC
  Filled 2015-11-07: qty 3

## 2015-11-07 MED ORDER — BUPIVACAINE HCL (PF) 0.25 % IJ SOLN
INTRAMUSCULAR | Status: DC | PRN
  Administered 2015-11-07: 18 mL

## 2015-11-07 MED ORDER — PANTOPRAZOLE SODIUM 40 MG PO TBEC: 40.0000 mg | DELAYED_RELEASE_TABLET | Freq: Every day | ORAL | Status: DC

## 2015-11-07 MED ORDER — KETOROLAC TROMETHAMINE 30 MG/ML IJ SOLN
30.0000 mg | Freq: Four times a day (QID) | INTRAMUSCULAR | Status: DC
  Administered 2015-11-07 – 2015-11-08 (×3): 30 mg via INTRAVENOUS
  Filled 2015-11-07 (×3): qty 1

## 2015-11-07 MED ORDER — SODIUM CHLORIDE 0.9 % IJ SOLN
INTRAMUSCULAR | Status: AC
  Filled 2015-11-07: qty 10

## 2015-11-07 MED ORDER — HYDROMORPHONE HCL 1 MG/ML IJ SOLN
INTRAMUSCULAR | Status: AC
  Filled 2015-11-07: qty 1

## 2015-11-07 MED ORDER — DEXAMETHASONE SODIUM PHOSPHATE 4 MG/ML IJ SOLN
INTRAMUSCULAR | Status: AC
  Filled 2015-11-07: qty 1

## 2015-11-07 MED ORDER — LIDOCAINE HCL (CARDIAC) 20 MG/ML IV SOLN
INTRAVENOUS | Status: AC
  Filled 2015-11-07: qty 5

## 2015-11-07 MED ORDER — PROPOFOL 10 MG/ML IV BOLUS
INTRAVENOUS | Status: DC | PRN
  Administered 2015-11-07: 200 mg via INTRAVENOUS

## 2015-11-07 MED ORDER — BUPIVACAINE HCL (PF) 0.25 % IJ SOLN
INTRAMUSCULAR | Status: AC
  Filled 2015-11-07: qty 30

## 2015-11-07 MED ORDER — HYDROMORPHONE HCL 1 MG/ML IJ SOLN
0.2000 mg | INTRAMUSCULAR | Status: DC | PRN
  Administered 2015-11-07: 0.5 mg via INTRAVENOUS
  Filled 2015-11-07: qty 1

## 2015-11-07 MED ORDER — ARTIFICIAL TEARS OP OINT
TOPICAL_OINTMENT | OPHTHALMIC | Status: DC | PRN
Start: 2015-11-07 — End: 2015-11-07
  Administered 2015-11-07: 1 via OPHTHALMIC

## 2015-11-07 MED ORDER — ONDANSETRON HCL 4 MG/2ML IJ SOLN
INTRAMUSCULAR | Status: AC
  Filled 2015-11-07: qty 2

## 2015-11-07 MED ORDER — SIMETHICONE 80 MG PO CHEW
80.0000 mg | CHEWABLE_TABLET | Freq: Four times a day (QID) | ORAL | Status: DC | PRN
  Administered 2015-11-07 – 2015-11-08 (×2): 80 mg via ORAL
  Filled 2015-11-07 (×2): qty 1

## 2015-11-07 MED ORDER — OXYCODONE-ACETAMINOPHEN 5-325 MG PO TABS
1.0000 | ORAL_TABLET | ORAL | Status: DC | PRN
  Administered 2015-11-07: 1 via ORAL
  Filled 2015-11-07: qty 1

## 2015-11-07 MED ORDER — NEOSTIGMINE METHYLSULFATE 10 MG/10ML IV SOLN
INTRAVENOUS | Status: AC
  Filled 2015-11-07: qty 1

## 2015-11-07 MED ORDER — SODIUM CHLORIDE 0.9 % IJ SOLN
INTRAMUSCULAR | Status: AC
  Filled 2015-11-07: qty 150

## 2015-11-07 MED ORDER — FENTANYL CITRATE (PF) 250 MCG/5ML IJ SOLN
INTRAMUSCULAR | Status: AC
  Filled 2015-11-07: qty 5

## 2015-11-07 MED ORDER — LABETALOL HCL 5 MG/ML IV SOLN
INTRAVENOUS | Status: AC
  Filled 2015-11-07: qty 4

## 2015-11-07 MED ORDER — SODIUM CHLORIDE 0.9 % IV SOLN
INTRAVENOUS | Status: DC | PRN
  Administered 2015-11-07: 120 mL

## 2015-11-07 MED ORDER — MENTHOL 3 MG MT LOZG: 1.0000 | LOZENGE | OROMUCOSAL | Status: DC | PRN

## 2015-11-07 MED ORDER — DEXAMETHASONE SODIUM PHOSPHATE 10 MG/ML IJ SOLN
INTRAMUSCULAR | Status: DC | PRN
  Administered 2015-11-07: 4 mg via INTRAVENOUS

## 2015-11-07 MED ORDER — LIDOCAINE HCL (CARDIAC) 20 MG/ML IV SOLN
INTRAVENOUS | Status: DC | PRN
  Administered 2015-11-07: 80 mg via INTRAVENOUS

## 2015-11-07 MED ORDER — LACTATED RINGERS IR SOLN
Status: DC | PRN
  Administered 2015-11-07: 3000 mL

## 2015-11-07 MED ORDER — KETOROLAC TROMETHAMINE 30 MG/ML IJ SOLN
INTRAMUSCULAR | Status: DC | PRN
  Administered 2015-11-07: 30 mg via INTRAVENOUS

## 2015-11-07 MED ORDER — VASOPRESSIN 20 UNIT/ML IV SOLN
INTRAVENOUS | Status: AC
  Filled 2015-11-07: qty 1

## 2015-11-07 MED ORDER — CEFAZOLIN SODIUM-DEXTROSE 2-3 GM-% IV SOLR
INTRAVENOUS | Status: AC
  Filled 2015-11-07: qty 50

## 2015-11-07 MED ORDER — CEFAZOLIN SODIUM-DEXTROSE 2-3 GM-% IV SOLR
2.0000 g | INTRAVENOUS | Status: AC
  Administered 2015-11-07: 2 g via INTRAVENOUS

## 2015-11-07 MED ORDER — LACTATED RINGERS IV SOLN
INTRAVENOUS | Status: DC
Start: 2015-11-07 — End: 2015-11-07
  Administered 2015-11-07 (×4): via INTRAVENOUS

## 2015-11-07 MED ORDER — ONDANSETRON HCL 4 MG/2ML IJ SOLN: 4.0000 mg | Freq: Four times a day (QID) | INTRAMUSCULAR | Status: DC | PRN

## 2015-11-07 MED ORDER — PROPOFOL 10 MG/ML IV BOLUS
INTRAVENOUS | Status: AC
  Filled 2015-11-07: qty 20

## 2015-11-07 MED ORDER — FENTANYL CITRATE (PF) 100 MCG/2ML IJ SOLN
INTRAMUSCULAR | Status: DC | PRN
  Administered 2015-11-07: 50 ug via INTRAVENOUS
  Administered 2015-11-07 (×4): 100 ug via INTRAVENOUS
  Administered 2015-11-07 (×2): 50 ug via INTRAVENOUS
  Administered 2015-11-07: 100 ug via INTRAVENOUS
  Administered 2015-11-07: 50 ug via INTRAVENOUS
  Administered 2015-11-07: 100 ug via INTRAVENOUS
  Administered 2015-11-07: 50 ug via INTRAVENOUS

## 2015-11-07 MED ORDER — MEPERIDINE HCL 25 MG/ML IJ SOLN: 6.2500 mg | INTRAMUSCULAR | Status: DC | PRN

## 2015-11-07 MED ORDER — ONDANSETRON HCL 4 MG PO TABS: 4.0000 mg | ORAL_TABLET | Freq: Four times a day (QID) | ORAL | Status: DC | PRN

## 2015-11-07 MED ORDER — ONDANSETRON HCL 4 MG/2ML IJ SOLN
INTRAMUSCULAR | Status: DC | PRN
  Administered 2015-11-07: 4 mg via INTRAVENOUS

## 2015-11-07 MED ORDER — ROCURONIUM BROMIDE 100 MG/10ML IV SOLN
INTRAVENOUS | Status: DC | PRN
  Administered 2015-11-07: 50 mg via INTRAVENOUS

## 2015-11-07 MED ORDER — MIDAZOLAM HCL 2 MG/2ML IJ SOLN
INTRAMUSCULAR | Status: DC | PRN
  Administered 2015-11-07: 2 mg via INTRAVENOUS

## 2015-11-07 MED ORDER — METOCLOPRAMIDE HCL 5 MG/ML IJ SOLN: 10.0000 mg | Freq: Once | INTRAMUSCULAR | Status: DC | PRN

## 2015-11-07 MED ORDER — KETOROLAC TROMETHAMINE 30 MG/ML IJ SOLN
INTRAMUSCULAR | Status: AC
  Filled 2015-11-07: qty 1

## 2015-11-07 MED ORDER — SCOPOLAMINE 1 MG/3DAYS TD PT72
MEDICATED_PATCH | TRANSDERMAL | Status: AC
  Administered 2015-11-07: 1.5 mg via TRANSDERMAL
  Filled 2015-11-07: qty 1

## 2015-11-07 MED ORDER — IBUPROFEN 800 MG PO TABS: 800.0000 mg | ORAL_TABLET | Freq: Three times a day (TID) | ORAL | Status: DC | PRN

## 2015-11-07 MED ORDER — ROPIVACAINE HCL 5 MG/ML IJ SOLN
INTRAMUSCULAR | Status: AC
  Filled 2015-11-07: qty 60

## 2015-11-07 MED ORDER — NEOSTIGMINE METHYLSULFATE 10 MG/10ML IV SOLN
INTRAVENOUS | Status: DC | PRN
  Administered 2015-11-07: 4 mg via INTRAVENOUS

## 2015-11-07 MED ORDER — FENTANYL CITRATE (PF) 100 MCG/2ML IJ SOLN
INTRAMUSCULAR | Status: AC
  Filled 2015-11-07: qty 2

## 2015-11-07 MED ORDER — HYDROMORPHONE HCL 1 MG/ML IJ SOLN
0.2500 mg | INTRAMUSCULAR | Status: DC | PRN
  Administered 2015-11-07: 0.5 mg via INTRAVENOUS

## 2015-11-07 MED ORDER — LABETALOL HCL 5 MG/ML IV SOLN
INTRAVENOUS | Status: DC | PRN
  Administered 2015-11-07 (×2): 5 mg via INTRAVENOUS

## 2015-11-07 MED ORDER — GLYCOPYRROLATE 0.2 MG/ML IJ SOLN
INTRAMUSCULAR | Status: DC | PRN
  Administered 2015-11-07: 0.6 mg via INTRAVENOUS

## 2015-11-07 MED ORDER — SCOPOLAMINE 1 MG/3DAYS TD PT72
1.0000 | MEDICATED_PATCH | Freq: Once | TRANSDERMAL | Status: DC
  Administered 2015-11-07: 1.5 mg via TRANSDERMAL

## 2015-11-07 MED ORDER — KETOROLAC TROMETHAMINE 30 MG/ML IJ SOLN: 30.0000 mg | Freq: Four times a day (QID) | INTRAMUSCULAR | Status: DC

## 2015-11-07 MED ORDER — ROCURONIUM BROMIDE 100 MG/10ML IV SOLN
INTRAVENOUS | Status: AC
  Filled 2015-11-07: qty 1

## 2015-11-07 MED ORDER — MIDAZOLAM HCL 2 MG/2ML IJ SOLN
INTRAMUSCULAR | Status: AC
  Filled 2015-11-07: qty 2

## 2015-11-07 SURGICAL SUPPLY — 51 items
BARRIER ADHS 3X4 INTERCEED (GAUZE/BANDAGES/DRESSINGS) IMPLANT
BRR ADH 4X3 ABS CNTRL BYND (GAUZE/BANDAGES/DRESSINGS)
CATH FOLEY 3WAY  5CC 16FR (CATHETERS) ×1
CATH FOLEY 3WAY 5CC 16FR (CATHETERS) ×1 IMPLANT
CLOTH BEACON ORANGE TIMEOUT ST (SAFETY) ×2 IMPLANT
CONT PATH 16OZ SNAP LID 3702 (MISCELLANEOUS) ×2 IMPLANT
COVER BACK TABLE 60X90IN (DRAPES) ×4 IMPLANT
COVER TIP SHEARS 8 DVNC (MISCELLANEOUS) ×1 IMPLANT
COVER TIP SHEARS 8MM DA VINCI (MISCELLANEOUS) ×1
DECANTER SPIKE VIAL GLASS SM (MISCELLANEOUS) ×2 IMPLANT
DRSG OPSITE POSTOP 3X4 (GAUZE/BANDAGES/DRESSINGS) ×1 IMPLANT
DURAPREP 26ML APPLICATOR (WOUND CARE) ×2 IMPLANT
ELECT REM PT RETURN 9FT ADLT (ELECTROSURGICAL) ×2
ELECTRODE REM PT RTRN 9FT ADLT (ELECTROSURGICAL) ×1 IMPLANT
GAUZE VASELINE 3X9 (GAUZE/BANDAGES/DRESSINGS) IMPLANT
GLOVE BIOGEL PI IND STRL 7.0 (GLOVE) ×5 IMPLANT
GLOVE BIOGEL PI INDICATOR 7.0 (GLOVE) ×7
GLOVE ECLIPSE 6.5 STRL STRAW (GLOVE) ×2 IMPLANT
KIT ACCESSORY DA VINCI DISP (KITS) ×1
KIT ACCESSORY DVNC DISP (KITS) ×1 IMPLANT
LEGGING LITHOTOMY PAIR STRL (DRAPES) ×2 IMPLANT
LIQUID BAND (GAUZE/BANDAGES/DRESSINGS) ×2 IMPLANT
NEEDLE INSUFFLATION 150MM (ENDOMECHANICALS) ×2 IMPLANT
OCCLUDER COLPOPNEUMO (BALLOONS) ×2 IMPLANT
PACK ROBOT WH (CUSTOM PROCEDURE TRAY) ×2 IMPLANT
PACK ROBOTIC GOWN (GOWN DISPOSABLE) ×2 IMPLANT
PAD POSITIONING PINK XL (MISCELLANEOUS) ×2 IMPLANT
PAD PREP 24X48 CUFFED NSTRL (MISCELLANEOUS) ×4 IMPLANT
SCISSORS LAP 5X35 DISP (ENDOMECHANICALS) IMPLANT
SET CYSTO W/LG BORE CLAMP LF (SET/KITS/TRAYS/PACK) ×1 IMPLANT
SET IRRIG TUBING LAPAROSCOPIC (IRRIGATION / IRRIGATOR) ×2 IMPLANT
SET TRI-LUMEN FLTR TB AIRSEAL (TUBING) ×1 IMPLANT
SLEEVE XCEL OPT CAN 5 100 (ENDOMECHANICALS) ×2 IMPLANT
SUT VIC AB 0 CT1 36 (SUTURE) ×4 IMPLANT
SUT VICRYL 0 UR6 27IN ABS (SUTURE) ×2 IMPLANT
SUT VICRYL 4-0 PS2 18IN ABS (SUTURE) ×4 IMPLANT
SUT VLOC 180 0 9IN  GS21 (SUTURE) ×2
SUT VLOC 180 0 9IN GS21 (SUTURE) ×2 IMPLANT
SYR 50ML LL SCALE MARK (SYRINGE) ×2 IMPLANT
SYRINGE 10CC LL (SYRINGE) ×2 IMPLANT
TIP RUMI ORANGE 6.7MMX12CM (TIP) IMPLANT
TIP UTERINE 5.1X6CM LAV DISP (MISCELLANEOUS) IMPLANT
TIP UTERINE 6.7X10CM GRN DISP (MISCELLANEOUS) ×1 IMPLANT
TIP UTERINE 6.7X6CM WHT DISP (MISCELLANEOUS) IMPLANT
TIP UTERINE 6.7X8CM BLUE DISP (MISCELLANEOUS) IMPLANT
TOWEL OR 17X24 6PK STRL BLUE (TOWEL DISPOSABLE) ×6 IMPLANT
TROCAR DISP BLADELESS 8 DVNC (TROCAR) ×1 IMPLANT
TROCAR DISP BLADELESS 8MM (TROCAR) ×1
TROCAR PORT AIRSEAL 8X120 (TROCAR) ×1 IMPLANT
TROCAR Z-THREAD 12X150 (TROCAR) ×2 IMPLANT
WATER STERILE IRR 1000ML POUR (IV SOLUTION) ×6 IMPLANT

## 2015-11-07 NOTE — Brief Op Note (Signed)
11/07/2015  11:20 AM  PATIENT:  [name]  49 y.o. female  PRE-OPERATIVE DIAGNOSIS:  Dysfunctional Uterine Bleeding, Uterine Fibroid, IDA POST-OPERATIVE DIAGNOSIS:  Dysfunctional Uterine Bleeding, Uterine Fibroid, IDA  PROCEDURE:  Procedure(s): ROBOTIC ASSISTED TOTAL HYSTERECTOMY WITH BILATERAL SALPINGECTOMY (Bilateral)  SURGEON:  Surgeon(s) and Role:    * Servando Salina, MD - Primary  PHYSICIAN ASSISTANT:   ASSISTANTS: Rolitta dawson, CNM   ANESTHESIA:   general Findings: large fibroid uterus( 335 g), nl tubes, ovaries, nl appendix, nl liver edge EBL:  Total I/O In: 2000 [I.V.:2000] Out: 300 [Urine:200; Blood:100]  BLOOD ADMINISTERED:none  DRAINS: none   LOCAL MEDICATIONS USED:  MARCAINE    and OTHER ropivacaine  SPECIMEN:  Source of Specimen:  uterus with cervix, tubes  DISPOSITION OF SPECIMEN:  PATHOLOGY  COUNTS:  YES  TOURNIQUET:  * No tourniquets in log *  DICTATION: .Other Dictation: Dictation Number [id number]  PLAN OF CARE: Admit for overnight observation  PATIENT DISPOSITION:  PACU - hemodynamically stable.   Delay start of Pharmacological VTE agent (>24hrs) due to surgical blood loss or risk of bleeding: no

## 2015-11-07 NOTE — Anesthesia Procedure Notes (Signed)
Procedure Name: Intubation Date/Time: 11/07/2015 7:31 AM Performed by: Taro Hidrogo, Sheron Nightingale Pre-anesthesia Checklist: Patient identified, Timeout performed, Emergency Drugs available, Suction available and Patient being monitored Patient Re-evaluated:Patient Re-evaluated prior to inductionOxygen Delivery Method: Circle system utilized Preoxygenation: Pre-oxygenation with 100% oxygen Intubation Type: IV induction Ventilation: Mask ventilation without difficulty Laryngoscope Size: Mac and 3 Grade View: Grade I Tube type: Oral Number of attempts: 1 Placement Confirmation: ETT inserted through vocal cords under direct vision,  positive ETCO2 and breath sounds checked- equal and bilateral Secured at: 20 cm Tube secured with: at lip. Dental Injury: Teeth and Oropharynx as per pre-operative assessment

## 2015-11-07 NOTE — Transfer of Care (Signed)
Immediate Anesthesia Transfer of Care Note  Patient: [name]  Procedure(s) Performed: Procedure(s): ROBOTIC ASSISTED TOTAL HYSTERECTOMY WITH BILATERAL SALPINGECTOMY (Bilateral)  Patient Location: PACU  Anesthesia Type:General  Level of Consciousness: awake, alert  and oriented  Airway & Oxygen Therapy: Patient Spontanous Breathing and Patient connected to nasal cannula oxygen  Post-op Assessment: Report given to RN and Post -op Vital signs reviewed and stable  Post vital signs: Reviewed and stable  Last Vitals:  Filed Vitals:   11/07/15 0610  BP: 144/77  Pulse: 85  Temp: 123XX123 C    Complications: No apparent anesthesia complications

## 2015-11-07 NOTE — Addendum Note (Signed)
Addendum  created 11/07/15 1556 by Genevie Ann, CRNA   Modules edited: Anesthesia Medication Administration

## 2015-11-07 NOTE — Anesthesia Postprocedure Evaluation (Signed)
Anesthesia Post Note  Patient: [name]  Procedure(s) Performed: Procedure(s) (LRB): ROBOTIC ASSISTED TOTAL HYSTERECTOMY WITH BILATERAL SALPINGECTOMY (Bilateral)  Patient location during evaluation: PACU Anesthesia Type: General Level of consciousness: awake and alert Pain management: pain level controlled Vital Signs Assessment: post-procedure vital signs reviewed and stable Respiratory status: spontaneous breathing, nonlabored ventilation, respiratory function stable and patient connected to nasal cannula oxygen Cardiovascular status: blood pressure returned to baseline and stable Postop Assessment: no signs of nausea or vomiting Anesthetic complications: no    Last Vitals:  Filed Vitals:   11/07/15 1145 11/07/15 1200  BP: 126/78 120/73  Pulse: 71 72  Temp:    Resp: 16 16    Last Pain:  Filed Vitals:   11/07/15 1206  PainSc: Asleep                 Myran Arcia A.

## 2015-11-07 NOTE — Addendum Note (Signed)
Addendum  created 11/07/15 1530 by Ignacia Bayley, CRNA   Modules edited: Clinical Notes   Clinical Notes:  File: [id number]

## 2015-11-07 NOTE — Anesthesia Postprocedure Evaluation (Signed)
Anesthesia Post Note  Patient: [name]  Procedure(s) Performed: Procedure(s) (LRB): ROBOTIC ASSISTED TOTAL HYSTERECTOMY WITH BILATERAL SALPINGECTOMY (Bilateral)  Patient location during evaluation: [facility] Anesthesia Type: General Level of consciousness: awake Pain management: pain level controlled Vital Signs Assessment: post-procedure vital signs reviewed and stable Respiratory status: spontaneous breathing Cardiovascular status: stable Postop Assessment: no signs of nausea or vomiting and adequate PO intake Anesthetic complications: no    Last Vitals:  Filed Vitals:   11/07/15 1314 11/07/15 1415  BP: 136/71 132/71  Pulse: 66 72  Temp: 36.9 C 36.7 C  Resp: 16 16    Last Pain:  Filed Vitals:   11/07/15 1416  PainSc: 2                  Yosselyn Tax

## 2015-11-08 ENCOUNTER — Encounter (HOSPITAL_COMMUNITY): Payer: Self-pay | Admitting: Obstetrics and Gynecology

## 2015-11-08 ENCOUNTER — Other Ambulatory Visit: Payer: BC Managed Care – PPO

## 2015-11-08 DIAGNOSIS — N938 Other specified abnormal uterine and vaginal bleeding: Secondary | ICD-10-CM | POA: Diagnosis not present

## 2015-11-08 LAB — CBC
HCT: 29 % — ABNORMAL LOW (ref 36.0–46.0)
Hemoglobin: 9.1 g/dL — ABNORMAL LOW (ref 12.0–15.0)
MCH: 27.4 pg (ref 26.0–34.0)
MCHC: 31.4 g/dL (ref 30.0–36.0)
MCV: 87.3 fL (ref 78.0–100.0)
PLATELETS: 223 10*3/uL (ref 150–400)
RBC: 3.32 MIL/uL — AB (ref 3.87–5.11)
RDW: 13.2 % (ref 11.5–15.5)
WBC: 12.5 10*3/uL — ABNORMAL HIGH (ref 4.0–10.5)

## 2015-11-08 LAB — BASIC METABOLIC PANEL
Anion gap: 8 (ref 5–15)
CALCIUM: 8.1 mg/dL — AB (ref 8.9–10.3)
CO2: 24 mmol/L (ref 22–32)
CREATININE: 0.93 mg/dL (ref 0.44–1.00)
Chloride: 106 mmol/L (ref 101–111)
GFR calc Af Amer: >60 mL/min (ref >60)
GLUCOSE: 105 mg/dL — AB (ref 65–99)
POTASSIUM: 3.6 mmol/L (ref 3.5–5.1)
SODIUM: 138 mmol/L (ref 135–145)

## 2015-11-08 MED ORDER — OXYCODONE-ACETAMINOPHEN 5-325 MG PO TABS: 1.0000 | ORAL_TABLET | ORAL | Status: DC | PRN

## 2015-11-08 MED ORDER — IBUPROFEN 800 MG PO TABS: 800.0000 mg | ORAL_TABLET | Freq: Three times a day (TID) | ORAL | Status: DC | PRN

## 2015-11-08 MED ORDER — POTASSIUM CHLORIDE 20 MEQ/15ML (10%) PO SOLN
40.0000 meq | Freq: Two times a day (BID) | ORAL | Status: DC
Start: 2015-11-08 — End: 2015-11-08
  Filled 2015-11-08: qty 30

## 2015-11-08 MED ORDER — POTASSIUM CHLORIDE 20 MEQ PO PACK: 40.0000 meq | PACK | Freq: Two times a day (BID) | ORAL | Status: DC

## 2015-11-08 NOTE — Progress Notes (Signed)
Pt escorted to main entrance accompanied by NT. Pts family member will be driving her home. [name]

## 2015-11-08 NOTE — Progress Notes (Signed)
Pt verbalizes understanding of d/c instructions, medications, follow up appts, when to seek medical attention and belongings policy. All questions were answered. Pt is currently waiting for her husband to arrive to take her home. Pt has copy of d/c instructions, "Recovering from Surgery" booklet and prescription at this time. Marry Guan

## 2015-11-08 NOTE — Progress Notes (Signed)
Subjective: Patient reports tolerating PO, + flatus and no problems voiding.    Objective: I have reviewed patient's vital signs.  vital signs, intake and output and labs. Filed Vitals:   11/08/15 0120 11/08/15 0605  BP: 121/56 111/67  Pulse: 70 72  Temp: 98.4 F (36.9 C) 99 F (37.2 C)  Resp: 18 18   I/O last 3 completed shifts: In: 4265.1 [P.O.:720; I.V.:3545.1] Out: 2075 [Urine:1975; Blood:100]    Lab Results  Component Value Date   WBC 12.5* 11/08/2015   HGB 9.1* 11/08/2015   HCT 29.0* 11/08/2015   MCV 87.3 11/08/2015   PLT 223 11/08/2015   Lab Results  Component Value Date   CREATININE 0.93 11/08/2015    EXAM General: alert, cooperative and no distress Resp: clear to auscultation bilaterally Cardio: regular rate and rhythm, S1, S2 normal, no murmur, click, rub or gallop GI: soft, non-tender; bowel sounds normal; no masses,  no organomegaly Extremities: no edema, redness or tenderness in the calves or thighs Vaginal Bleeding: none  Assessment: s/p Procedure(s): ROBOTIC ASSISTED TOTAL HYSTERECTOMY WITH BILATERAL SALPINGECTOMY: stable and anemia  Plan: Advance diet Encourage ambulation Advance to PO medication Discontinue IV fluids Discharge home     Azir Muzyka A, MD 11/08/2015 8:06 AM    11/08/2015, 8:06 AM

## 2015-11-08 NOTE — Plan of Care (Signed)
Problem: Bowel/Gastric: Goal: Gastrointestinal status for postoperative course will improve Outcome: Completed/Met Date Met:  11/08/15 Burping and small amount of flatus, ambulation and warm liquids encouraged.

## 2015-11-08 NOTE — Discharge Summary (Signed)
Physician Discharge Summary  Patient ID: [name] MRN: [id number] DOB/AGE: [birth year] 49 y.o.  Admit date: 11/07/2015 Discharge date: 11/08/2015  Admission Diagnoses:DUB, fibroid uterus, IDA  Discharge Diagnoses: DUB, fibroid uterus, IDA Active Problems:   S/P hysterectomy   Discharged Condition: stable  Hospital Course: pt was admitted to [facility] where she underwent davinci robotic total hysterectomy, bilateral salpingectomy due to symptomatic uterine fibroid. Uncomplicated postoperative course  Consults: None  Significant Diagnostic Studies: labs:  CBC Latest Ref Rng 11/08/2015 11/06/2015 07/31/2014  WBC 4.0 - 10.5 K/uL 12.5(H) 6.5 9.5  Hemoglobin 12.0 - 15.0 g/dL 9.1(L) 10.6(L) 12.1  Hematocrit 36.0 - 46.0 % 29.0(L) 33.5(L) 36.2  Platelets 150 - 400 K/uL 223 266 232.0     Treatments: surgery: Da Vinci robotic hysterectomy, bilateral salpingectomy  Discharge Exam: Blood pressure 111/67, pulse 72, temperature 99 F (37.2 C), temperature source Oral, resp. rate 18, height 5\' 3"  (1.6 m), weight 80.287 kg (177 lb), SpO2 99 %. General appearance: alert, cooperative and no distress Back: no tenderness to percussion or palpation Resp: clear to auscultation bilaterally Cardio: regular rate and rhythm, S1, S2 normal, no murmur, click, rub or gallop GI: soft, non-tender; bowel sounds normal; no masses,  no organomegaly Pelvic: deferred Extremities: no edema or calf tenderness Skin: nl Incision/Wound:  Well approximated. No erythema/induration/exudate Disposition:   Discharge Instructions    Diet general    Complete by:  As directed      Discharge instructions    Complete by:  As directed   Call if temperature greater than equal to 100.4, nothing per vagina for 4-6 weeks or severe nausea vomiting, increased incisional pain , drainage or redness in the incision site, no straining with bowel movements, showers no bath     Discharge patient    Complete by:  As directed      Increase activity slowly    Complete by:  As directed      May walk up steps    Complete by:  As directed      No wound care    Complete by:  As directed             Medication List    TAKE these medications        ibuprofen 800 MG tablet  Commonly known as:  ADVIL,MOTRIN  Take 1 tablet (800 mg total) by mouth every 8 (eight) hours as needed (mild pain).  Start taking on:  11/12/2015     oxyCODONE-acetaminophen 5-325 MG tablet  Commonly known as:  PERCOCET/ROXICET  Take 1-2 tablets by mouth every 4 (four) hours as needed for severe pain (moderate to severe pain (when tolerating fluids)).           Follow-up Information    Follow up with Zyler Hyson A, MD In 2 weeks.   Specialty:  Obstetrics and Gynecology   Contact information:   [address] [place] [address] [phone]       Signed: Grenda Lora A 11/08/2015, 8:06 AM

## 2015-11-08 NOTE — Op Note (Signed)
[name], [name] NO.:  [id number]  MEDICAL RECORD NO.:  [id number]  LOCATION:  [id number]                          FACILITY:  [facility]  PHYSICIAN:  Servando Salina, M.D.DATE OF BIRTH:  1966/09/29  DATE OF PROCEDURE:  11/07/2015 DATE OF DISCHARGE:                              OPERATIVE REPORT   PREOPERATIVE DIAGNOSES:  Dysfunctional uterine bleeding, uterine fibroids.  PROCEDURE:  da-Vinci robotic-assisted total hysterectomy, bilateral salpingectomy.  POSTOPERATIVE DIAGNOSES:  Dysfunctional uterine bleeding, fibroid uterus.  ANESTHESIA:  General.  SURGEON:  Servando Salina, MD  ASSISTANT:  Laury Deep, CNM  DESCRIPTION OF PROCEDURE:  Under adequate general anesthesia, the patient was placed in the dorsal lithotomy position.  She was sterilely prepped and draped in usual fashion.  The patient was positioned for robotic surgery.  Examination under anesthesia had revealed about a 14 week size uterus.  No adnexal masses could be appreciated, but limited by the enlarged uterus.  A weighted speculum was placed in the vagina. Sims retractor was placed anteriorly.  The cervix was noted.  0 Vicryl figure-of-eight suture was placed on the anterior posterior lip of the cervix.  The uterus sounded to 11 cm.  A #10 uterine manipulator and a small RUMI cup was subsequently placed.  A 3-way Foley was also placed sterilely.  The retractors were removed.  Attention was then turned to the abdomen.  The patient had undergone previous robotic myomectomy and those port sites were used including the supraumbilical site incision, 0.25% Marcaine was injected along all those incisions.  The supraumbilical incision was then made through the previous scar.  Veress needle was introduced, tested, and opening pressure of 6 was noted.  3 L of CO2 was insufflated.  Veress needle was removed at 12-mm disposable trocar where sleeve was introduced into the abdomen without  incident. The robotic camera was then placed through that site.  Panoramic inspection was noted with single adhesion in the left anterior wall from the uterus to the anterior wall.  Normal liver edge.  Wide uterus was noted.  The patient was placed in Trendelenburg position.  Manipulation of the uterus was noted to be limited.  At that point, it was noted that patient was flipped up on the bed.  She was taken out of the Trendelenburg position and repositioned and subsequently the additional port sites that had been in place were utilized with 2 #8 mm robotic ports were placed, one on the left and one on the right and a 12 mm AirSeal was placed in the right lower quadrant.  The robot was docked to the patient's left side in the arm #1 monopolar scissors, arm #2, PK dissector was then placed and in arm #3, the ProGrasp was placed.  I then went to the surgical console.  At the surgical console, the pelvis was inspected.  Uterus was shifted and was slightly dextrorotated.  Both ureters were seen peristalsing.  Appendix was normal.  Both ovaries were normal.  No endometriosis noted.  The procedure was started right where the fallopian tube was grasped.  The mesosalpinx was opened with cautery and transection of the right tube was then done and removed.  The right retroperitoneal space was then opened and a window was placed in the posterior leaf.  The right utero-ovarian ligaments were then serially clamped, cauterized, and then cut.  The round ligament was then identified, clamped, cauterized, and then cut, carried around to the anterior leaf of the vesicouterine peritoneum.  The uterine vessels were slightly skeletonized.  The posterior peritoneum was then continued to be dissected and to drop the ureter down.  Attention was then turned to the anterior area and the vesicouterine peritoneum was then opened transversely.  The bladder was sharply dissected off the lower uterine segment and over  the RUMI cup.  Attention was then turned to the opposite side.  The left tube proximal distance was clamped, cauterized, cut.  The left retroperitoneal space was then opened, blunt dissection was then performed and a window was placed in the posterior leaf.  The left utero-ovarian ligament was serially clamped, cauterized, and then cut.  Round ligament was subsequently clamped, cauterized, and then cut and the vesicouterine peritoneum was further opened.  The uterine vessels were then skeletonized.  The posterior peritoneum was cut and brought down further.  Uterine vessels were identified, serially clamped, cauterized, and then cut at the cervicovaginal junction. Attention was then turned back to the opposite side at which time, there was a multiple bleeding noted from the uterine vessels which were then subsequently clamped and cauterized.  The arms #1 and #2 robotic instruments were switched in order to facilitate surgery on the right. Once this was done, the vessels isolated and the ureter was noted to be further deep underneath the right uterine vessel.  The vessels were then serially clamped, cauterized, and then cut.  The vaginal insufflator was then done.  The bladder was further dissected inferiorly and a circumferential incision was then made, just above the [facility] ring and carried around circumferentially with detachment of the uterus from the vaginal attachment.  The specimen was then pulled into the field.  Due to the wideness of the specimen, decision was then made to morcellate using the scissors with morcellation in the midline, this was carried around anteriorly and posteriorly.  Two large submucosal fibroids were noted, a small fibroid was removed in the process of the dissection and it was carried down all the way at the upper part of the cervix.  I then left the surgical console and with retraction, the cervix was identified.  The uterine manipulator was removed.  The  incision was made straight down the middle of the cervix and part of the cervix was removed.  The remaining specimen was able to be then grasped and brought down through the vagina.  A small fibroid was removed off the subserosal surface prior to removing the specimen in its entirety.  At that point, the vaginal insufflator was then reinserted.  I went back to the surgical console to resumed the procedure.  The vaginal cuff had good hemostasis.  The attention was then turned to the left remaining fallopian tube which was then grasped and the underlying mesosalpinx was serially clamped, cauterized, and cut with removal of that tube as well. The instruments were then removed replacing the arm #1 and #2 with the large mega suture needle driver and a long-tipped forceps respectively. The 0 V-Loc suture was then used to close the vaginal cuff in running stitch x2.  The vaginal cuff was then digitally palpated, noted to be well approximated.  The abdomen was irrigated and suctioned.  No bleeders were noted.  The ovarian pedicles were inspected and had good  Hemostasis.  The ureters were noted to be still peristalsing well and at that point, the procedure was felt to be complete.  The robotic instruments were removed.  The robot was undocked.  I then went back to the patient's bedside sterilely.  Irrigation of fluid was suctioned out of the pelvis and ropivacaine was instilled in the pelvis.  The robotic instruments were removed under direct visualization.  The AirSeal was deflated.  The sites were then closed with fascial stitch of 0 Vicryl were necessary and 4-0 Vicryl subcuticular sutures.  Specimen was a morcellated uterus with cervix and fallopian tube weighing 335 g, sent to Pathology.  Estimated blood loss was 100 mL.  Intraoperative fluid was 2 L.  Urine output was 200 mL.  Sponge and instrument counts x2 was correct.  Complication was none.  The patient tolerated the procedure well, was  transferred to recovery room in stable condition.     Servando Salina, M.D.     [place]/MEDQ  D:  11/08/2015  T:  11/08/2015  Job:  [id number]

## 2015-11-08 NOTE — Plan of Care (Signed)
Problem: Education: Goal: Understanding of sexual limitations or changes related to disease process or condition will improve Outcome: Completed/Met Date Met:  11/08/15 Explained nothing in vaginal for 4-6 wks unless MD advises otherwise at follow up appts, pt verbalizes understanding.   Problem: Self-Concept: Goal: Communication of feelings regarding changes in body function or appearance will improve Outcome: Not Applicable Date Met:  70/11/00 No expression of concerns regarding body changes/function.

## 2015-11-19 ENCOUNTER — Encounter: Payer: BC Managed Care – PPO | Admitting: Family Medicine

## 2016-02-13 ENCOUNTER — Other Ambulatory Visit: Payer: Self-pay

## 2016-02-13 DIAGNOSIS — Z1231 Encounter for screening mammogram for malignant neoplasm of breast: Secondary | ICD-10-CM

## 2016-03-23 ENCOUNTER — Ambulatory Visit
Admission: RE | Admit: 2016-03-23 | Discharge: 2016-03-23 | Disposition: A | Discharge status: Home / Self Care | Payer: BC Managed Care – PPO | Source: Ambulatory Visit

## 2016-03-23 DIAGNOSIS — Z1231 Encounter for screening mammogram for malignant neoplasm of breast: Secondary | ICD-10-CM

## 2016-03-30 ENCOUNTER — Telehealth: Payer: Self-pay | Admitting: Behavioral Health

## 2016-03-30 ENCOUNTER — Encounter: Payer: Self-pay | Admitting: Behavioral Health

## 2016-03-30 NOTE — Telephone Encounter (Signed)
Pre-Visit Call completed with patient and chart updated.   Pre-Visit Info documented in Specialty Comments under [employer].    

## 2016-03-31 ENCOUNTER — Ambulatory Visit (INDEPENDENT_AMBULATORY_CARE_PROVIDER_SITE_OTHER): Payer: BC Managed Care – PPO | Admitting: Family Medicine

## 2016-03-31 ENCOUNTER — Encounter: Payer: Self-pay | Admitting: Family Medicine

## 2016-03-31 VITALS — BP 108/70 | HR 66 | Temp 98.0°F | Ht 63.0 in | Wt 176.1 lb

## 2016-03-31 DIAGNOSIS — D509 Iron deficiency anemia, unspecified: Secondary | ICD-10-CM

## 2016-03-31 DIAGNOSIS — E782 Mixed hyperlipidemia: Secondary | ICD-10-CM

## 2016-03-31 DIAGNOSIS — Z0000 Encounter for general adult medical examination without abnormal findings: Secondary | ICD-10-CM

## 2016-03-31 DIAGNOSIS — E7800 Pure hypercholesterolemia, unspecified: Secondary | ICD-10-CM

## 2016-03-31 DIAGNOSIS — K5900 Constipation, unspecified: Secondary | ICD-10-CM | POA: Diagnosis not present

## 2016-03-31 DIAGNOSIS — R739 Hyperglycemia, unspecified: Secondary | ICD-10-CM | POA: Diagnosis not present

## 2016-03-31 DIAGNOSIS — G43009 Migraine without aura, not intractable, without status migrainosus: Secondary | ICD-10-CM

## 2016-03-31 DIAGNOSIS — T7840XD Allergy, unspecified, subsequent encounter: Secondary | ICD-10-CM

## 2016-03-31 NOTE — Assessment & Plan Note (Signed)
Encouraged increased hydration and fiber in diet. Daily probiotics. If bowels not moving can use MOM 2 tbls po in 4 oz of warm prune juice by mouth every 2-3 days. If no results then repeat in 4 hours with  Dulcolax suppository pr, may repeat again in 4 more hours as needed. Seek care if symptoms worsen. Consider daily Miralax and/or Dulcolax if symptoms persist. Has been using Senna and/or dulcolax

## 2016-03-31 NOTE — Assessment & Plan Note (Signed)
Better with lack of artificial sweeteners. Still has hormonal cycles. Encouraged increased hydration, 64 ounces of clear fluids daily. Minimize alcohol and caffeine. Eat small frequent meals with lean proteins and complex carbs. Avoid high and low blood sugars. Get adequate sleep, 7-8 hours a night. Needs exercise daily preferably in the morning.

## 2016-03-31 NOTE — Progress Notes (Signed)
Pre visit review using our clinic review tool, if applicable. No additional management support is needed unless otherwise documented below in the visit note. 

## 2016-03-31 NOTE — Patient Instructions (Signed)
NOW probiotic daily and benefiber or Metamucil  64 oz clear fluids.  Preventive Care for Adults, Female A healthy lifestyle and preventive care can promote health and wellness. Preventive health guidelines for women include the following key practices.  A routine yearly physical is a good way to check with your health care provider about your health and preventive screening. It is a chance to share any concerns and updates on your health and to receive a thorough exam.  Visit your dentist for a routine exam and preventive care every 6 months. Brush your teeth twice a day and floss once a day. Good oral hygiene prevents tooth decay and gum disease.  The frequency of eye exams is based on your age, health, family medical history, use of contact lenses, and other factors. Follow your health care provider's recommendations for frequency of eye exams.  Eat a healthy diet. Foods like vegetables, fruits, whole grains, low-fat dairy products, and lean protein foods contain the nutrients you need without too many calories. Decrease your intake of foods high in solid fats, added sugars, and salt. Eat the right amount of calories for you.Get information about a proper diet from your health care provider, if necessary.  Regular physical exercise is one of the most important things you can do for your health. Most adults should get at least 150 minutes of moderate-intensity exercise (any activity that increases your heart rate and causes you to sweat) each week. In addition, most adults need muscle-strengthening exercises on 2 or more days a week.  Maintain a healthy weight. The body mass index (BMI) is a screening tool to identify possible weight problems. It provides an estimate of body fat based on height and weight. Your health care provider can find your BMI and can help you achieve or maintain a healthy weight.For adults 20 years and older:  A BMI below 18.5 is considered underweight.  A BMI of 18.5  to 24.9 is normal.  A BMI of 25 to 29.9 is considered overweight.  A BMI of 30 and above is considered obese.  Maintain normal blood lipids and cholesterol levels by exercising and minimizing your intake of saturated fat. Eat a balanced diet with plenty of fruit and vegetables. Blood tests for lipids and cholesterol should begin at age 72 and be repeated every 5 years. If your lipid or cholesterol levels are high, you are over 50, or you are at high risk for heart disease, you may need your cholesterol levels checked more frequently.Ongoing high lipid and cholesterol levels should be treated with medicines if diet and exercise are not working.  If you smoke, find out from your health care provider how to quit. If you do not use tobacco, do not start.  Lung cancer screening is recommended for adults aged 38-80 years who are at high risk for developing lung cancer because of a history of smoking. A yearly low-dose CT scan of the lungs is recommended for people who have at least a 30-pack-year history of smoking and are a current smoker or have quit within the past 15 years. A pack year of smoking is smoking an average of 1 pack of cigarettes a day for 1 year (for example: 1 pack a day for 30 years or 2 packs a day for 15 years). Yearly screening should continue until the smoker has stopped smoking for at least 15 years. Yearly screening should be stopped for people who develop a health problem that would prevent them from having lung cancer  treatment.  If you are pregnant, do not drink alcohol. If you are breastfeeding, be very cautious about drinking alcohol. If you are not pregnant and choose to drink alcohol, do not have more than 1 drink per day. One drink is considered to be 12 ounces (355 mL) of beer, 5 ounces (148 mL) of wine, or 1.5 ounces (44 mL) of liquor.  Avoid use of street drugs. Do not share needles with anyone. Ask for help if you need support or instructions about stopping the use of  drugs.  High blood pressure causes heart disease and increases the risk of stroke. Your blood pressure should be checked at least every 1 to 2 years. Ongoing high blood pressure should be treated with medicines if weight loss and exercise do not work.  If you are 75-48 years old, ask your health care provider if you should take aspirin to prevent strokes.  Diabetes screening is done by taking a blood sample to check your blood glucose level after you have not eaten for a certain period of time (fasting). If you are not overweight and you do not have risk factors for diabetes, you should be screened once every 3 years starting at age 61. If you are overweight or obese and you are 62-45 years of age, you should be screened for diabetes every year as part of your cardiovascular risk assessment.  Breast cancer screening is essential preventive care for women. You should practice "breast self-awareness." This means understanding the normal appearance and feel of your breasts and may include breast self-examination. Any changes detected, no matter how small, should be reported to a health care provider. Women in their 3s and 30s should have a clinical breast exam (CBE) by a health care provider as part of a regular health exam every 1 to 3 years. After age 90, women should have a CBE every year. Starting at age 77, women should consider having a mammogram (breast X-ray test) every year. Women who have a family history of breast cancer should talk to their health care provider about genetic screening. Women at a high risk of breast cancer should talk to their health care providers about having an MRI and a mammogram every year.  Breast cancer gene (BRCA)-related cancer risk assessment is recommended for women who have family members with BRCA-related cancers. BRCA-related cancers include breast, ovarian, tubal, and peritoneal cancers. Having family members with these cancers may be associated with an increased  risk for harmful changes (mutations) in the breast cancer genes BRCA1 and BRCA2. Results of the assessment will determine the need for genetic counseling and BRCA1 and BRCA2 testing.  Your health care provider may recommend that you be screened regularly for cancer of the pelvic organs (ovaries, uterus, and vagina). This screening involves a pelvic examination, including checking for microscopic changes to the surface of your cervix (Pap test). You may be encouraged to have this screening done every 3 years, beginning at age 53.  For women ages 44-65, health care providers may recommend pelvic exams and Pap testing every 3 years, or they may recommend the Pap and pelvic exam, combined with testing for human papilloma virus (HPV), every 5 years. Some types of HPV increase your risk of cervical cancer. Testing for HPV may also be done on women of any age with unclear Pap test results.  Other health care providers may not recommend any screening for nonpregnant women who are considered low risk for pelvic cancer and who do not have symptoms.  Ask your health care provider if a screening pelvic exam is right for you.  If you have had past treatment for cervical cancer or a condition that could lead to cancer, you need Pap tests and screening for cancer for at least 20 years after your treatment. If Pap tests have been discontinued, your risk factors (such as having a new sexual partner) need to be reassessed to determine if screening should resume. Some women have medical problems that increase the chance of getting cervical cancer. In these cases, your health care provider may recommend more frequent screening and Pap tests.  Colorectal cancer can be detected and often prevented. Most routine colorectal cancer screening begins at the age of 35 years and continues through age 56 years. However, your health care provider may recommend screening at an earlier age if you have risk factors for colon cancer. On a  yearly basis, your health care provider may provide home test kits to check for hidden blood in the stool. Use of a small camera at the end of a tube, to directly examine the colon (sigmoidoscopy or colonoscopy), can detect the earliest forms of colorectal cancer. Talk to your health care provider about this at age 75, when routine screening begins. Direct exam of the colon should be repeated every 5-10 years through age 76 years, unless early forms of precancerous polyps or small growths are found.  People who are at an increased risk for hepatitis B should be screened for this virus. You are considered at high risk for hepatitis B if:  You were born in a country where hepatitis B occurs often. Talk with your health care provider about which countries are considered high risk.  Your parents were born in a high-risk country and you have not received a shot to protect against hepatitis B (hepatitis B vaccine).  You have HIV or AIDS.  You use needles to inject street drugs.  You live with, or have sex with, someone who has hepatitis B.  You get hemodialysis treatment.  You take certain medicines for conditions like cancer, organ transplantation, and autoimmune conditions.  Hepatitis C blood testing is recommended for all people born from [birth year] through 1965 and any individual with known risks for hepatitis C.  Practice safe sex. Use condoms and avoid high-risk sexual practices to reduce the spread of sexually transmitted infections (STIs). STIs include gonorrhea, chlamydia, syphilis, trichomonas, herpes, HPV, and human immunodeficiency virus (HIV). Herpes, HIV, and HPV are viral illnesses that have no cure. They can result in disability, cancer, and death.  You should be screened for sexually transmitted illnesses (STIs) including gonorrhea and chlamydia if:  You are sexually active and are younger than 24 years.  You are older than 24 years and your health care provider tells you that you are  at risk for this type of infection.  Your sexual activity has changed since you were last screened and you are at an increased risk for chlamydia or gonorrhea. Ask your health care provider if you are at risk.  If you are at risk of being infected with HIV, it is recommended that you take a prescription medicine daily to prevent HIV infection. This is called preexposure prophylaxis (PrEP). You are considered at risk if:  You are sexually active and do not regularly use condoms or know the HIV status of your partner(s).  You take drugs by injection.  You are sexually active with a partner who has HIV.  Talk with your health care provider  about whether you are at high risk of being infected with HIV. If you choose to begin PrEP, you should first be tested for HIV. You should then be tested every 3 months for as long as you are taking PrEP.  Osteoporosis is a disease in which the bones lose minerals and strength with aging. This can result in serious bone fractures or breaks. The risk of osteoporosis can be identified using a bone density scan. Women ages 53 years and over and women at risk for fractures or osteoporosis should discuss screening with their health care providers. Ask your health care provider whether you should take a calcium supplement or vitamin D to reduce the rate of osteoporosis.  Menopause can be associated with physical symptoms and risks. Hormone replacement therapy is available to decrease symptoms and risks. You should talk to your health care provider about whether hormone replacement therapy is right for you.  Use sunscreen. Apply sunscreen liberally and repeatedly throughout the day. You should seek shade when your shadow is shorter than you. Protect yourself by wearing long sleeves, pants, a wide-brimmed hat, and sunglasses year round, whenever you are outdoors.  Once a month, do a whole body skin exam, using a mirror to look at the skin on your back. Tell your health  care provider of new moles, moles that have irregular borders, moles that are larger than a pencil eraser, or moles that have changed in shape or color.  Stay current with required vaccines (immunizations).  Influenza vaccine. All adults should be immunized every year.  Tetanus, diphtheria, and acellular pertussis (Td, Tdap) vaccine. Pregnant women should receive 1 dose of Tdap vaccine during each pregnancy. The dose should be obtained regardless of the length of time since the last dose. Immunization is preferred during the 27th-36th week of gestation. An adult who has not previously received Tdap or who does not know her vaccine status should receive 1 dose of Tdap. This initial dose should be followed by tetanus and diphtheria toxoids (Td) booster doses every 10 years. Adults with an unknown or incomplete history of completing a 3-dose immunization series with Td-containing vaccines should begin or complete a primary immunization series including a Tdap dose. Adults should receive a Td booster every 10 years.  Varicella vaccine. An adult without evidence of immunity to varicella should receive 2 doses or a second dose if she has previously received 1 dose. Pregnant females who do not have evidence of immunity should receive the first dose after pregnancy. This first dose should be obtained before leaving the health care facility. The second dose should be obtained 4-8 weeks after the first dose.  Human papillomavirus (HPV) vaccine. Females aged 13-26 years who have not received the vaccine previously should obtain the 3-dose series. The vaccine is not recommended for use in pregnant females. However, pregnancy testing is not needed before receiving a dose. If a female is found to be pregnant after receiving a dose, no treatment is needed. In that case, the remaining doses should be delayed until after the pregnancy. Immunization is recommended for any person with an immunocompromised condition through  the age of 37 years if she did not get any or all doses earlier. During the 3-dose series, the second dose should be obtained 4-8 weeks after the first dose. The third dose should be obtained 24 weeks after the first dose and 16 weeks after the second dose.  Zoster vaccine. One dose is recommended for adults aged 62 years or older unless  certain conditions are present.  Measles, mumps, and rubella (MMR) vaccine. Adults born before [birth year] generally are considered immune to measles and mumps. Adults born in [birth year] or later should have 1 or more doses of MMR vaccine unless there is a contraindication to the vaccine or there is laboratory evidence of immunity to each of the three diseases. A routine second dose of MMR vaccine should be obtained at least 28 days after the first dose for students attending postsecondary schools, health care workers, or international travelers. People who received inactivated measles vaccine or an unknown type of measles vaccine during 1963-1967 should receive 2 doses of MMR vaccine. People who received inactivated mumps vaccine or an unknown type of mumps vaccine before 1979 and are at high risk for mumps infection should consider immunization with 2 doses of MMR vaccine. For females of childbearing age, rubella immunity should be determined. If there is no evidence of immunity, females who are not pregnant should be vaccinated. If there is no evidence of immunity, females who are pregnant should delay immunization until after pregnancy. Unvaccinated health care workers born before [birth year] who lack laboratory evidence of measles, mumps, or rubella immunity or laboratory confirmation of disease should consider measles and mumps immunization with 2 doses of MMR vaccine or rubella immunization with 1 dose of MMR vaccine.  Pneumococcal 13-valent conjugate (PCV13) vaccine. When indicated, a person who is uncertain of his immunization history and has no record of immunization should receive the  PCV13 vaccine. All adults 49 years of age and older should receive this vaccine. An adult aged 81 years or older who has certain medical conditions and has not been previously immunized should receive 1 dose of PCV13 vaccine. This PCV13 should be followed with a dose of pneumococcal polysaccharide (PPSV23) vaccine. Adults who are at high risk for pneumococcal disease should obtain the PPSV23 vaccine at least 8 weeks after the dose of PCV13 vaccine. Adults older than 49 years of age who have normal immune system function should obtain the PPSV23 vaccine dose at least 1 year after the dose of PCV13 vaccine.  Pneumococcal polysaccharide (PPSV23) vaccine. When PCV13 is also indicated, PCV13 should be obtained first. All adults aged 63 years and older should be immunized. An adult younger than age 5 years who has certain medical conditions should be immunized. Any person who resides in a nursing home or long-term care facility should be immunized. An adult smoker should be immunized. People with an immunocompromised condition and certain other conditions should receive both PCV13 and PPSV23 vaccines. People with human immunodeficiency virus (HIV) infection should be immunized as soon as possible after diagnosis. Immunization during chemotherapy or radiation therapy should be avoided. Routine use of PPSV23 vaccine is not recommended for American Indians, [place] Natives, or people younger than 65 years unless there are medical conditions that require PPSV23 vaccine. When indicated, people who have unknown immunization and have no record of immunization should receive PPSV23 vaccine. One-time revaccination 5 years after the first dose of PPSV23 is recommended for people aged 19-64 years who have chronic kidney failure, nephrotic syndrome, asplenia, or immunocompromised conditions. People who received 1-2 doses of PPSV23 before age 77 years should receive another dose of PPSV23 vaccine at age 45 years or later if at least  5 years have passed since the previous dose. Doses of PPSV23 are not needed for people immunized with PPSV23 at or after age 53 years.  Meningococcal vaccine. Adults with asplenia or persistent complement component deficiencies should receive  2 doses of quadrivalent meningococcal conjugate (MenACWY-D) vaccine. The doses should be obtained at least 2 months apart. Microbiologists working with certain meningococcal bacteria, [employer] recruits, people at risk during an outbreak, and people who travel to or live in countries with a high rate of meningitis should be immunized. A first-year college student up through age 47 years who is living in a residence hall should receive a dose if she did not receive a dose on or after her 16th birthday. Adults who have certain high-risk conditions should receive one or more doses of vaccine.  Hepatitis A vaccine. Adults who wish to be protected from this disease, have certain high-risk conditions, work with hepatitis A-infected animals, work in hepatitis A research labs, or travel to or work in countries with a high rate of hepatitis A should be immunized. Adults who were previously unvaccinated and who anticipate close contact with an international adoptee during the first 60 days after arrival in the [place] from a country with a high rate of hepatitis A should be immunized.  Hepatitis B vaccine. Adults who wish to be protected from this disease, have certain high-risk conditions, may be exposed to blood or other infectious body fluids, are household contacts or sex partners of hepatitis B positive people, are clients or workers in certain care facilities, or travel to or work in countries with a high rate of hepatitis B should be immunized.  Haemophilus influenzae type b (Hib) vaccine. A previously unvaccinated person with asplenia or sickle cell disease or having a scheduled splenectomy should receive 1 dose of Hib vaccine. Regardless of previous immunization, a  recipient of a hematopoietic stem cell transplant should receive a 3-dose series 6-12 months after her successful transplant. Hib vaccine is not recommended for adults with HIV infection. Preventive Services / Frequency Ages 62 to 34 years  Blood pressure check.** / Every 3-5 years.  Lipid and cholesterol check.** / Every 5 years beginning at age 70.  Clinical breast exam.** / Every 3 years for women in their 58s and 74s.  BRCA-related cancer risk assessment.** / For women who have family members with a BRCA-related cancer (breast, ovarian, tubal, or peritoneal cancers).  Pap test.** / Every 2 years from ages 82 through 86. Every 3 years starting at age 66 through age 15 or 70 with a history of 3 consecutive normal Pap tests.  HPV screening.** / Every 3 years from ages 21 through ages 14 to 18 with a history of 3 consecutive normal Pap tests.  Hepatitis C blood test.** / For any individual with known risks for hepatitis C.  Skin self-exam. / Monthly.  Influenza vaccine. / Every year.  Tetanus, diphtheria, and acellular pertussis (Tdap, Td) vaccine.** / Consult your health care provider. Pregnant women should receive 1 dose of Tdap vaccine during each pregnancy. 1 dose of Td every 10 years.  Varicella vaccine.** / Consult your health care provider. Pregnant females who do not have evidence of immunity should receive the first dose after pregnancy.  HPV vaccine. / 3 doses over 6 months, if 70 and younger. The vaccine is not recommended for use in pregnant females. However, pregnancy testing is not needed before receiving a dose.  Measles, mumps, rubella (MMR) vaccine.** / You need at least 1 dose of MMR if you were born in 1957 or later. You may also need a 2nd dose. For females of childbearing age, rubella immunity should be determined. If there is no evidence of immunity, females who are not pregnant  should be vaccinated. If there is no evidence of immunity, females who are pregnant  should delay immunization until after pregnancy.  Pneumococcal 13-valent conjugate (PCV13) vaccine.** / Consult your health care provider.  Pneumococcal polysaccharide (PPSV23) vaccine.** / 1 to 2 doses if you smoke cigarettes or if you have certain conditions.  Meningococcal vaccine.** / 1 dose if you are age 2 to 26 years and a Market researcher living in a residence hall, or have one of several medical conditions, you need to get vaccinated against meningococcal disease. You may also need additional booster doses.  Hepatitis A vaccine.** / Consult your health care provider.  Hepatitis B vaccine.** / Consult your health care provider.  Haemophilus influenzae type b (Hib) vaccine.** / Consult your health care provider. Ages 31 to 60 years  Blood pressure check.** / Every year.  Lipid and cholesterol check.** / Every 5 years beginning at age 70 years.  Lung cancer screening. / Every year if you are aged 40-80 years and have a 30-pack-year history of smoking and currently smoke or have quit within the past 15 years. Yearly screening is stopped once you have quit smoking for at least 15 years or develop a health problem that would prevent you from having lung cancer treatment.  Clinical breast exam.** / Every year after age 74 years.  BRCA-related cancer risk assessment.** / For women who have family members with a BRCA-related cancer (breast, ovarian, tubal, or peritoneal cancers).  Mammogram.** / Every year beginning at age 42 years and continuing for as long as you are in good health. Consult with your health care provider.  Pap test.** / Every 3 years starting at age 28 years through age 42 or 96 years with a history of 3 consecutive normal Pap tests.  HPV screening.** / Every 3 years from ages 57 years through ages 85 to 78 years with a history of 3 consecutive normal Pap tests.  Fecal occult blood test (FOBT) of stool. / Every year beginning at age 91 years and  continuing until age 45 years. You may not need to do this test if you get a colonoscopy every 10 years.  Flexible sigmoidoscopy or colonoscopy.** / Every 5 years for a flexible sigmoidoscopy or every 10 years for a colonoscopy beginning at age 74 years and continuing until age 6 years.  Hepatitis C blood test.** / For all people born from [birth year] through 1964 and any individual with known risks for hepatitis C.  Skin self-exam. / Monthly.  Influenza vaccine. / Every year.  Tetanus, diphtheria, and acellular pertussis (Tdap/Td) vaccine.** / Consult your health care provider. Pregnant women should receive 1 dose of Tdap vaccine during each pregnancy. 1 dose of Td every 10 years.  Varicella vaccine.** / Consult your health care provider. Pregnant females who do not have evidence of immunity should receive the first dose after pregnancy.  Zoster vaccine.** / 1 dose for adults aged 69 years or older.  Measles, mumps, rubella (MMR) vaccine.** / You need at least 1 dose of MMR if you were born in 1957 or later. You may also need a second dose. For females of childbearing age, rubella immunity should be determined. If there is no evidence of immunity, females who are not pregnant should be vaccinated. If there is no evidence of immunity, females who are pregnant should delay immunization until after pregnancy.  Pneumococcal 13-valent conjugate (PCV13) vaccine.** / Consult your health care provider.  Pneumococcal polysaccharide (PPSV23) vaccine.** / 1 to 2 doses if  you smoke cigarettes or if you have certain conditions.  Meningococcal vaccine.** / Consult your health care provider.  Hepatitis A vaccine.** / Consult your health care provider.  Hepatitis B vaccine.** / Consult your health care provider.  Haemophilus influenzae type b (Hib) vaccine.** / Consult your health care provider. Ages 35 years and over  Blood pressure check.** / Every year.  Lipid and cholesterol check.** / Every 5 years  beginning at age 66 years.  Lung cancer screening. / Every year if you are aged 11-80 years and have a 30-pack-year history of smoking and currently smoke or have quit within the past 15 years. Yearly screening is stopped once you have quit smoking for at least 15 years or develop a health problem that would prevent you from having lung cancer treatment.  Clinical breast exam.** / Every year after age 51 years.  BRCA-related cancer risk assessment.** / For women who have family members with a BRCA-related cancer (breast, ovarian, tubal, or peritoneal cancers).  Mammogram.** / Every year beginning at age 30 years and continuing for as long as you are in good health. Consult with your health care provider.  Pap test.** / Every 3 years starting at age 71 years through age 63 or 74 years with 3 consecutive normal Pap tests. Testing can be stopped between 65 and 70 years with 3 consecutive normal Pap tests and no abnormal Pap or HPV tests in the past 10 years.  HPV screening.** / Every 3 years from ages 53 years through ages 24 or 48 years with a history of 3 consecutive normal Pap tests. Testing can be stopped between 65 and 70 years with 3 consecutive normal Pap tests and no abnormal Pap or HPV tests in the past 10 years.  Fecal occult blood test (FOBT) of stool. / Every year beginning at age 54 years and continuing until age 36 years. You may not need to do this test if you get a colonoscopy every 10 years.  Flexible sigmoidoscopy or colonoscopy.** / Every 5 years for a flexible sigmoidoscopy or every 10 years for a colonoscopy beginning at age 27 years and continuing until age 66 years.  Hepatitis C blood test.** / For all people born from [birth year] through 1964 and any individual with known risks for hepatitis C.  Osteoporosis screening.** / A one-time screening for women ages 41 years and over and women at risk for fractures or osteoporosis.  Skin self-exam. / Monthly.  Influenza vaccine. /  Every year.  Tetanus, diphtheria, and acellular pertussis (Tdap/Td) vaccine.** / 1 dose of Td every 10 years.  Varicella vaccine.** / Consult your health care provider.  Zoster vaccine.** / 1 dose for adults aged 53 years or older.  Pneumococcal 13-valent conjugate (PCV13) vaccine.** / Consult your health care provider.  Pneumococcal polysaccharide (PPSV23) vaccine.** / 1 dose for all adults aged 45 years and older.  Meningococcal vaccine.** / Consult your health care provider.  Hepatitis A vaccine.** / Consult your health care provider.  Hepatitis B vaccine.** / Consult your health care provider.  Haemophilus influenzae type b (Hib) vaccine.** / Consult your health care provider. ** Family history and personal history of risk and conditions may change your health care provider's recommendations.   This information is not intended to replace advice given to you by your health care provider. Make sure you discuss any questions you have with your health care provider.   Document Released: 01/05/2002 Document Revised: 11/30/2014 Document Reviewed: 04/06/2011 Elsevier Interactive Patient Education 2016 Elsevier  Inc[employer]

## 2016-03-31 NOTE — Progress Notes (Signed)
Subjective:    Patient ID: [name], female    DOB: [birth year], 50 y.o.   MRN: [id number]  Chief Complaint  Patient presents with  . Annual Exam    HPI Patient is in today for Annual Physical Exam. Patient reports she is having some concerns with constipation since December after having fibroid surgery. Patient reports that headaches have improved since cutting out certain sugars.  Denies CP/palp/SOB/HA/congestion/fevers/GI or GU c/o. Taking meds as prescribed.  Past Medical History  Diagnosis Date  . Hypercholesteremia   . Hemorrhoids   . Migraine headache   . Anemia   . 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 Nasal congestion   . Anal bleeding   . Blood in stool   . Easy bruising   . Allergy   . Allergic state 08/05/2014  . GERD (gastroesophageal reflux disease)   . Constipation 01/09/2015    Past Surgical History  Procedure Laterality Date  . Myomectomy  04/2009    Dr. Dellis Filbert  . Hemorrhoid surgery  06/19/2012    Dr. Fanny Skates  . Eye surgery Bilateral     lasik  . Robotic assisted total hysterectomy with salpingectomy Bilateral 11/07/2015    Procedure: ROBOTIC ASSISTED TOTAL HYSTERECTOMY WITH BILATERAL SALPINGECTOMY;  Surgeon: Servando Salina, MD;  Location: [facility];  Service: Gynecology;  Laterality: Bilateral;    Family History  Problem Relation Age of Onset  . Stroke Father   . Hypertension Father   . Cancer Mother 32    breast/bone cancer  . Heart disease Mother     CHF  . Hypertension Mother   . Hyperlipidemia Mother   . Heart attack Mother   . Stroke Mother   . Diabetes Mother     type 2  . COPD Mother   . Kidney disease Mother     stage 4  . Migraines Sister   . Allergies Sister     seasonal  . Congestive Heart Failure Maternal Grandmother   . Diabetes Maternal Grandmother     type 2  . Hypertension Maternal Grandmother     Social History   Social History  . Marital Status: Married    Spouse Name:  spencer  . Number of Children: 0  . 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 Smoking status: Never Smoker   . Smokeless tobacco: Never Used  . Alcohol Use: Yes     Comment: socially - sometimes  . Drug Use: No  . 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 Not on file   Social History Narrative    Outpatient Prescriptions Prior to Visit  Medication Sig Dispense Refill  . cetirizine (ZYRTEC) 10 MG tablet Take 10 mg by mouth daily.    . Multiple Vitamin (MULTIVITAMIN) tablet Take 1 tablet by mouth daily.    . vitamin E 600 UNIT capsule Take 600 Units by mouth daily.     No facility-administered medications prior to visit.    No Known Allergies  Review of Systems  Constitutional: Negative for fever and malaise/fatigue.  HENT: Positive for congestion.   Eyes: Negative for blurred vision.  Respiratory: Negative for shortness of breath.   Cardiovascular: Negative for chest  pain, palpitations and leg swelling.  Gastrointestinal: Positive for constipation. Negative for nausea, abdominal pain and blood in stool.  Genitourinary: Negative for dysuria and frequency.  Musculoskeletal: Negative for falls.  Skin: Negative for rash.  Neurological: Negative for dizziness, loss of consciousness and headaches.  Endo/Heme/Allergies: Negative for environmental allergies.  Psychiatric/Behavioral: Negative for depression. The patient is not nervous/anxious.        Objective:    Physical Exam  Constitutional: She is oriented to person, place, and time. She appears well-developed and well-nourished. No distress.  HENT:  Head: Normocephalic and atraumatic.  Eyes: Conjunctivae are normal.  Neck: Neck supple. No thyromegaly present.  Cardiovascular: Normal rate, regular rhythm and normal  heart sounds.   No murmur heard. Pulmonary/Chest: Effort normal and breath sounds normal. No respiratory distress.  Abdominal: Soft. Bowel sounds are normal. She exhibits no distension and no mass. There is no tenderness.  Musculoskeletal: She exhibits no edema.  Lymphadenopathy:    She has no cervical adenopathy.  Neurological: She is alert and oriented to person, place, and time.  Skin: Skin is warm and dry.  Psychiatric: She has a normal mood and affect. Her behavior is normal.    BP 108/70 mmHg  Pulse 66  Temp(Src) 98 F (36.7 C) (Oral)  Ht '5\' 3"'  (1.6 m)  Wt 176 lb 2 oz (79.89 kg)  BMI 31.21 kg/m2  SpO2 99% Wt Readings from Last 3 Encounters:  03/31/16 176 lb 2 oz (79.89 kg)  11/07/15 177 lb (80.287 kg)  11/06/15 180 lb (81.647 kg)     Lab Results  Component Value Date   WBC 12.5* 11/08/2015   HGB 9.1* 11/08/2015   HCT 29.0* 11/08/2015   PLT 223 11/08/2015   GLUCOSE 105* 11/08/2015   CHOL 199 07/31/2014   TRIG 74.0 07/31/2014   HDL 49.80 07/31/2014   LDLDIRECT 155.9 03/13/2013   LDLCALC 134* 07/31/2014   ALT 19 07/31/2014   AST 21 07/31/2014   NA 138 11/08/2015   K 3.6 11/08/2015   CL 106 11/08/2015   CREATININE 0.93 11/08/2015   BUN <5* 11/08/2015   CO2 24 11/08/2015   TSH 1.00 07/31/2014    Lab Results  Component Value Date   TSH 1.00 07/31/2014   Lab Results  Component Value Date   WBC 12.5* 11/08/2015   HGB 9.1* 11/08/2015   HCT 29.0* 11/08/2015   MCV 87.3 11/08/2015   PLT 223 11/08/2015   Lab Results  Component Value Date   NA 138 11/08/2015   K 3.6 11/08/2015   CO2 24 11/08/2015   GLUCOSE 105* 11/08/2015   BUN <5* 11/08/2015   CREATININE 0.93 11/08/2015   BILITOT 0.4 07/31/2014   ALKPHOS 60 07/31/2014   AST 21 07/31/2014   ALT 19 07/31/2014   PROT 7.5 07/31/2014   ALBUMIN 4.1 07/31/2014   ALBUMIN 4.1 07/31/2014   CALCIUM 8.1* 11/08/2015   ANIONGAP 8 11/08/2015   GFR 86.27 07/31/2014   Lab Results  Component Value Date   CHOL  199 07/31/2014   Lab Results  Component Value Date   HDL 49.80 07/31/2014   Lab Results  Component Value Date   LDLCALC 134* 07/31/2014   Lab Results  Component Value Date   TRIG 74.0 07/31/2014   Lab Results  Component Value Date   CHOLHDL 4 07/31/2014   No results found for: HGBA1C     Assessment & Plan:   Problem List Items Addressed This Visit    Preventative health care  Patient encouraged to maintain heart healthy diet, regular exercise, adequate sleep. Consider daily probiotics. Take medications as prescribed      Relevant Orders   CBC   Lipid panel   TSH   Comp Met (CMET)   Migraine    Better with lack of artificial sweeteners. Still has hormonal cycles. Encouraged increased hydration, 64 ounces of clear fluids daily. Minimize alcohol and caffeine. Eat small frequent meals with lean proteins and complex carbs. Avoid high and low blood sugars. Get adequate sleep, 7-8 hours a night. Needs exercise daily preferably in the morning.      Iron deficiency anemia - Primary   Relevant Orders   CBC   Lipid panel   TSH   Comp Met (CMET)   HYPERCHOLESTEROLEMIA    Encouraged heart healthy diet, increase exercise, avoid trans fats, consider a krill oil cap daily      Constipation    Encouraged increased hydration and fiber in diet. Daily probiotics. If bowels not moving can use MOM 2 tbls po in 4 oz of warm prune juice by mouth every 2-3 days. If no results then repeat in 4 hours with  Dulcolax suppository pr, may repeat again in 4 more hours as needed. Seek care if symptoms worsen. Consider daily Miralax and/or Dulcolax if symptoms persist. Has been using Senna and/or dulcolax      Allergic state    Zyrtec prn, Flonase prn       Other Visit Diagnoses    Hyperglycemia        Relevant Orders    CBC    Lipid panel    TSH    Comp Met (CMET)    Hemoglobin A1c    Hyperlipidemia, mixed        Relevant Orders    CBC    Lipid panel    TSH    Comp Met (CMET)         I am having [name] maintain her multivitamin, cetirizine, and vitamin E.  No orders of the defined types were placed in this encounter.     Penni Homans, MD

## 2016-04-05 NOTE — Assessment & Plan Note (Signed)
Patient encouraged to maintain heart healthy diet, regular exercise, adequate sleep. Consider daily probiotics. Take medications as prescribed 

## 2016-04-05 NOTE — Assessment & Plan Note (Signed)
Zyrtec prn, Flonase prn

## 2016-04-05 NOTE — Assessment & Plan Note (Signed)
Encouraged heart healthy diet, increase exercise, avoid trans fats, consider a krill oil cap daily 

## 2016-04-07 ENCOUNTER — Other Ambulatory Visit (INDEPENDENT_AMBULATORY_CARE_PROVIDER_SITE_OTHER): Payer: BC Managed Care – PPO

## 2016-04-07 DIAGNOSIS — D509 Iron deficiency anemia, unspecified: Secondary | ICD-10-CM

## 2016-04-07 DIAGNOSIS — R739 Hyperglycemia, unspecified: Secondary | ICD-10-CM | POA: Diagnosis not present

## 2016-04-07 DIAGNOSIS — E782 Mixed hyperlipidemia: Secondary | ICD-10-CM | POA: Diagnosis not present

## 2016-04-07 DIAGNOSIS — Z0000 Encounter for general adult medical examination without abnormal findings: Secondary | ICD-10-CM | POA: Diagnosis not present

## 2016-04-07 LAB — COMPREHENSIVE METABOLIC PANEL
ALK PHOS: 65 U/L (ref 39–117)
ALT: 14 U/L (ref 0–35)
AST: 20 U/L (ref 0–37)
Albumin: 4.1 g/dL (ref 3.5–5.2)
BUN: 8 mg/dL (ref 6–23)
CHLORIDE: 107 meq/L (ref 96–112)
CO2: 27 mEq/L (ref 19–32)
Calcium: 9.1 mg/dL (ref 8.4–10.5)
Creatinine, Ser: 0.88 mg/dL (ref 0.40–1.20)
GFR: 87.91 mL/min (ref >60.00)
GLUCOSE: 88 mg/dL (ref 70–99)
POTASSIUM: 3.8 meq/L (ref 3.5–5.1)
Sodium: 140 mEq/L (ref 135–145)
TOTAL PROTEIN: 7 g/dL (ref 6.0–8.3)
Total Bilirubin: 0.2 mg/dL (ref 0.2–1.2)

## 2016-04-07 LAB — CBC
HEMATOCRIT: 34.3 % — AB (ref 36.0–46.0)
HEMOGLOBIN: 11.2 g/dL — AB (ref 12.0–15.0)
MCHC: 32.7 g/dL (ref 30.0–36.0)
MCV: 84.9 fl (ref 78.0–100.0)
Platelets: 209 10*3/uL (ref 150.0–400.0)
RBC: 4.04 Mil/uL (ref 3.87–5.11)
RDW: 15.2 % (ref 11.5–15.5)
WBC: 6 10*3/uL (ref 4.0–10.5)

## 2016-04-07 LAB — LIPID PANEL
CHOLESTEROL: 185 mg/dL (ref 0–200)
HDL: 48.1 mg/dL (ref >39.00)
LDL CALC: 124 mg/dL — AB (ref 0–99)
NONHDL: 137.18
Total CHOL/HDL Ratio: 4
Triglycerides: 68 mg/dL (ref 0.0–149.0)
VLDL: 13.6 mg/dL (ref 0.0–40.0)

## 2016-04-07 LAB — HEMOGLOBIN A1C: HEMOGLOBIN A1C: 6.1 % (ref 4.6–6.5)

## 2016-04-07 LAB — TSH: TSH: 1.26 u[IU]/mL (ref 0.35–4.50)

## 2016-09-29 ENCOUNTER — Ambulatory Visit (INDEPENDENT_AMBULATORY_CARE_PROVIDER_SITE_OTHER): Payer: BC Managed Care – PPO | Admitting: Family Medicine

## 2016-09-29 ENCOUNTER — Encounter: Payer: Self-pay | Admitting: Family Medicine

## 2016-09-29 VITALS — BP 120/84 | HR 59 | Temp 98.4°F | Ht 63.0 in | Wt 176.4 lb

## 2016-09-29 DIAGNOSIS — R739 Hyperglycemia, unspecified: Secondary | ICD-10-CM | POA: Diagnosis not present

## 2016-09-29 DIAGNOSIS — D509 Iron deficiency anemia, unspecified: Secondary | ICD-10-CM

## 2016-09-29 DIAGNOSIS — E782 Mixed hyperlipidemia: Secondary | ICD-10-CM | POA: Diagnosis not present

## 2016-09-29 DIAGNOSIS — E7800 Pure hypercholesterolemia, unspecified: Secondary | ICD-10-CM

## 2016-09-29 DIAGNOSIS — G43909 Migraine, unspecified, not intractable, without status migrainosus: Secondary | ICD-10-CM

## 2016-09-29 MED ORDER — PROBIOTIC DAILY PO CAPS: 1.0000 | ORAL_CAPSULE | Freq: Every day | ORAL | 3 refills | Status: DC

## 2016-09-29 NOTE — Patient Instructions (Signed)
DASH Eating Plan  DASH stands for "Dietary Approaches to Stop Hypertension." The DASH eating plan is a healthy eating plan that has been shown to reduce high blood pressure (hypertension). Additional health benefits may include reducing the risk of type 2 diabetes mellitus, heart disease, and stroke. The DASH eating plan may also help with weight loss.  WHAT DO I NEED TO KNOW ABOUT THE DASH EATING PLAN?  For the DASH eating plan, you will follow these general guidelines:  · Choose foods with a percent daily value for sodium of less than 5% (as listed on the food label).  · Use salt-free seasonings or herbs instead of table salt or sea salt.  · Check with your health care provider or pharmacist before using salt substitutes.  · Eat lower-sodium products, often labeled as "lower sodium" or "no salt added."  · Eat fresh foods.  · Eat more vegetables, fruits, and low-fat dairy products.  · Choose whole grains. Look for the word "whole" as the first word in the ingredient list.  · Choose fish and skinless chicken or turkey more often than red meat. Limit fish, poultry, and meat to 6 oz (170 g) each day.  · Limit sweets, desserts, sugars, and sugary drinks.  · Choose heart-healthy fats.  · Limit cheese to 1 oz (28 g) per day.  · Eat more home-cooked food and less restaurant, buffet, and fast food.  · Limit fried foods.  · Cook foods using methods other than frying.  · Limit canned vegetables. If you do use them, rinse them well to decrease the sodium.  · When eating at a restaurant, ask that your food be prepared with less salt, or no salt if possible.  WHAT FOODS CAN I EAT?  Seek help from a dietitian for individual calorie needs.  Grains  Whole grain or whole wheat bread. Brown rice. Whole grain or whole wheat pasta. Quinoa, bulgur, and whole grain cereals. Low-sodium cereals. Corn or whole wheat flour tortillas. Whole grain cornbread. Whole grain crackers. Low-sodium crackers.  Vegetables  Fresh or frozen vegetables  (raw, steamed, roasted, or grilled). Low-sodium or reduced-sodium tomato and vegetable juices. Low-sodium or reduced-sodium tomato sauce and paste. Low-sodium or reduced-sodium canned vegetables.   Fruits  All fresh, canned (in natural juice), or frozen fruits.  Meat and Other Protein Products  Ground beef (85% or leaner), grass-fed beef, or beef trimmed of fat. Skinless chicken or turkey. Ground chicken or turkey. Pork trimmed of fat. All fish and seafood. Eggs. Dried beans, peas, or lentils. Unsalted nuts and seeds. Unsalted canned beans.  Dairy  Low-fat dairy products, such as skim or 1% milk, 2% or reduced-fat cheeses, low-fat ricotta or cottage cheese, or plain low-fat yogurt. Low-sodium or reduced-sodium cheeses.  Fats and Oils  Tub margarines without trans fats. Light or reduced-fat mayonnaise and salad dressings (reduced sodium). Avocado. Safflower, olive, or canola oils. Natural peanut or almond butter.  Other  Unsalted popcorn and pretzels.  The items listed above may not be a complete list of recommended foods or beverages. Contact your dietitian for more options.  WHAT FOODS ARE NOT RECOMMENDED?  Grains  White bread. White pasta. White rice. Refined cornbread. Bagels and croissants. Crackers that contain trans fat.  Vegetables  Creamed or fried vegetables. Vegetables in a cheese sauce. Regular canned vegetables. Regular canned tomato sauce and paste. Regular tomato and vegetable juices.  Fruits  Dried fruits. Canned fruit in light or heavy syrup. Fruit juice.  Meat and Other Protein   Products  Fatty cuts of meat. Ribs, chicken wings, bacon, sausage, bologna, salami, chitterlings, fatback, hot dogs, bratwurst, and packaged luncheon meats. Salted nuts and seeds. Canned beans with salt.  Dairy  Whole or 2% milk, cream, half-and-half, and cream cheese. Whole-fat or sweetened yogurt. Full-fat cheeses or blue cheese. Nondairy creamers and whipped toppings. Processed cheese, cheese spreads, or cheese  curds.  Condiments  Onion and garlic salt, seasoned salt, table salt, and sea salt. Canned and packaged gravies. Worcestershire sauce. Tartar sauce. Barbecue sauce. Teriyaki sauce. Soy sauce, including reduced sodium. Steak sauce. Fish sauce. Oyster sauce. Cocktail sauce. Horseradish. Ketchup and mustard. Meat flavorings and tenderizers. Bouillon cubes. Hot sauce. Tabasco sauce. Marinades. Taco seasonings. Relishes.  Fats and Oils  Butter, stick margarine, lard, shortening, ghee, and bacon fat. Coconut, palm kernel, or palm oils. Regular salad dressings.  Other  Pickles and olives. Salted popcorn and pretzels.  The items listed above may not be a complete list of foods and beverages to avoid. Contact your dietitian for more information.  WHERE CAN I FIND MORE INFORMATION?  National Heart, Lung, and [employer] Institute: www.nhlbi.nih.gov/health/health-topics/topics/dash/     This information is not intended to replace advice given to you by your health care provider. Make sure you discuss any questions you have with your health care provider.     Document Released: 10/29/2011 Document Revised: 11/30/2014 Document Reviewed: 09/13/2013  Elsevier Interactive Patient Education ©2016 [employer] Inc[employer]

## 2016-09-29 NOTE — Progress Notes (Signed)
Pre visit review using our clinic review tool, if applicable. No additional management support is needed unless otherwise documented below in the visit note. 

## 2016-09-30 MED FILL — NOW PROBIOTIC-10: 100 days supply | Qty: 100 | Fill #0

## 2016-10-04 NOTE — Progress Notes (Signed)
Patient ID: [name], female   DOB: 03/04/1966, 50 y.o.   MRN: [id number]   Subjective:    Patient ID: [name], female    DOB: October 08, 1966, 50 y.o.   MRN: [id number]  Chief Complaint  Patient presents with  . Follow-up    HPI Patient is in today for follow up. No recent illness or acute new concerns. Has been trying to remain active and eat a heart healthy diet. Denies CP/palp/SOB/HA/congestion/fevers/GI or GU c/o. Taking meds as prescribed  Past Medical History:  Diagnosis Date  . Allergic state 08/05/2014  . Allergy   . Anal bleeding   . Anemia   . Blood in stool   . Constipation 01/09/2015  . Dermatitis due to food taken internally    rast to milk--class 2  . Easy bruising   . GERD (gastroesophageal reflux disease)   . Hemorrhoids   . Hypercholesteremia   . Migraine headache   . Nasal congestion     Past Surgical History:  Procedure Laterality Date  . EYE SURGERY Bilateral    lasik  . HEMORRHOID SURGERY  06/19/2012   Dr. Fanny Skates  . MYOMECTOMY  04/2009   Dr. Dellis Filbert  . ROBOTIC ASSISTED TOTAL HYSTERECTOMY WITH SALPINGECTOMY Bilateral 11/07/2015   Procedure: ROBOTIC ASSISTED TOTAL HYSTERECTOMY WITH BILATERAL SALPINGECTOMY;  Surgeon: Servando Salina, MD;  Location: [facility];  Service: Gynecology;  Laterality: Bilateral;    Family History  Problem Relation Age of Onset  . Stroke Father   . Hypertension Father   . Cancer Mother 68    breast/bone cancer  . Heart disease Mother     CHF  . Hypertension Mother   . Hyperlipidemia Mother   . Heart attack Mother   . Stroke Mother   . Diabetes Mother     type 2  . COPD Mother   . Kidney disease Mother     stage 4  . Migraines Sister   . Allergies Sister     seasonal  . Congestive Heart Failure Maternal Grandmother   . Diabetes Maternal Grandmother     type 2  . Hypertension Maternal Grandmother     Social History   Social History  . Marital status: Married    Spouse name: spencer  . Number of  children: 0  . Years of education: N/A   Occupational History  . teacher at [employer], in [place]    Social History Main Topics  . Smoking status: Never Smoker  . Smokeless tobacco: Never Used  . Alcohol use Yes     Comment: socially - sometimes  . Drug use: No  . 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 Not on file   Social History Narrative  . No narrative on file    Outpatient Medications Prior to Visit  Medication Sig Dispense Refill  . Multiple Vitamin (MULTIVITAMIN) tablet Take 1 tablet by mouth daily.    . vitamin E 600 UNIT capsule Take 600 Units by mouth daily.    . cetirizine (ZYRTEC) 10 MG tablet Take 10 mg by mouth daily.     No facility-administered medications prior to visit.     No Known Allergies  Review of Systems  Constitutional: Negative for fever and malaise/fatigue.  HENT: Negative for congestion.   Eyes: Negative for blurred vision.  Respiratory: Negative for shortness  of breath.   Cardiovascular: Negative for chest pain, palpitations and leg swelling.  Gastrointestinal: Positive for constipation. Negative for abdominal pain, blood in stool and nausea.  Genitourinary: Negative for dysuria and frequency.  Musculoskeletal: Negative for falls.  Skin: Negative for rash.  Neurological: Negative for dizziness, loss of consciousness and headaches.  Endo/Heme/Allergies: Negative for environmental allergies.  Psychiatric/Behavioral: Negative for depression. The patient is not nervous/anxious.        Objective:    Physical Exam  Constitutional: She is oriented to person, place, and time. She appears well-developed and well-nourished. No distress.  HENT:  Head: Normocephalic and atraumatic.  Nose: Nose normal.  Eyes: Right eye exhibits no discharge. Left eye exhibits no discharge.  Neck: Normal range of motion. Neck  supple.  Cardiovascular: Normal rate and regular rhythm.   No murmur heard. Pulmonary/Chest: Effort normal and breath sounds normal.  Abdominal: Soft. Bowel sounds are normal. There is no tenderness.  Musculoskeletal: She exhibits no edema.  Neurological: She is alert and oriented to person, place, and time.  Skin: Skin is warm and dry.  Psychiatric: She has a normal mood and affect.  Nursing note and vitals reviewed.   BP 120/84 (BP Location: Left Arm, Patient Position: Sitting, Cuff Size: Normal)   Pulse (!) 59   Temp 98.4 F (36.9 C) (Oral)   Ht 5\' 3"  (1.6 m)   Wt 176 lb 6 oz (80 kg)   BMI 31.24 kg/m  Wt Readings from Last 3 Encounters:  09/29/16 176 lb 6 oz (80 kg)  03/31/16 176 lb 2 oz (79.9 kg)  11/07/15 177 lb (80.3 kg)     Lab Results  Component Value Date   WBC 6.0 04/07/2016   HGB 11.2 (L) 04/07/2016   HCT 34.3 (L) 04/07/2016   PLT 209.0 04/07/2016   GLUCOSE 88 04/07/2016   CHOL 185 04/07/2016   TRIG 68.0 04/07/2016   HDL 48.10 04/07/2016   LDLDIRECT 155.9 03/13/2013   LDLCALC 124 (H) 04/07/2016   ALT 14 04/07/2016   AST 20 04/07/2016   NA 140 04/07/2016   K 3.8 04/07/2016   CL 107 04/07/2016   CREATININE 0.88 04/07/2016   BUN 8 04/07/2016   CO2 27 04/07/2016   TSH 1.26 04/07/2016   HGBA1C 6.1 04/07/2016    Lab Results  Component Value Date   TSH 1.26 04/07/2016   Lab Results  Component Value Date   WBC 6.0 04/07/2016   HGB 11.2 (L) 04/07/2016   HCT 34.3 (L) 04/07/2016   MCV 84.9 04/07/2016   PLT 209.0 04/07/2016   Lab Results  Component Value Date   NA 140 04/07/2016   K 3.8 04/07/2016   CO2 27 04/07/2016   GLUCOSE 88 04/07/2016   BUN 8 04/07/2016   CREATININE 0.88 04/07/2016   BILITOT 0.2 04/07/2016   ALKPHOS 65 04/07/2016   AST 20 04/07/2016   ALT 14 04/07/2016   PROT 7.0 04/07/2016   ALBUMIN 4.1 04/07/2016   CALCIUM 9.1 04/07/2016   ANIONGAP 8 11/08/2015   GFR 87.91 04/07/2016   Lab Results  Component Value Date   CHOL  185 04/07/2016   Lab Results  Component Value Date   HDL 48.10 04/07/2016   Lab Results  Component Value Date   LDLCALC 124 (H) 04/07/2016   Lab Results  Component Value Date   TRIG 68.0 04/07/2016   Lab Results  Component Value Date   CHOLHDL 4 04/07/2016   Lab Results  Component Value Date  HGBA1C 6.1 04/07/2016       Assessment & Plan:   Problem List Items Addressed This Visit    HYPERCHOLESTEROLEMIA    Encouraged heart healthy diet, increase exercise, avoid trans fats, consider a krill oil cap daily      Migraine    Encouraged increased hydration, 64 ounces of clear fluids daily. Minimize alcohol and caffeine. Eat small frequent meals with lean proteins and complex carbs. Avoid high and low blood sugars. Get adequate sleep, 7-8 hours a night. Needs exercise daily preferably in the morning.      Iron deficiency anemia    Increase leafy greens, consider increased lean red meat and using cast iron cookware. Continue to monitor, report any concerns       Other Visit Diagnoses    Hyperglycemia    -  Primary   Relevant Orders   Lipid panel   Comprehensive metabolic panel   Hemoglobin A1c   Mixed hyperlipidemia       Relevant Orders   Lipid panel   Comprehensive metabolic panel   Hemoglobin A1c      I have discontinued Ms. Dib's cetirizine and Probiotic Product (PROBIOTIC DAILY PO). I am also having her start on PROBIOTIC DAILY. Additionally, I am having her maintain her multivitamin and vitamin E.  Meds ordered this encounter  Medications  . DISCONTD: Probiotic Product (PROBIOTIC DAILY PO)    Sig: Take by mouth.  . Probiotic Product (PROBIOTIC DAILY) CAPS    Sig: Take 1 capsule by mouth daily. NOW probiotics    Dispense:  100 capsule    Refill:  3     Penni Homans, MD

## 2016-10-04 NOTE — Assessment & Plan Note (Signed)
Encouraged heart healthy diet, increase exercise, avoid trans fats, consider a krill oil cap daily 

## 2016-10-04 NOTE — Assessment & Plan Note (Signed)
Encouraged increased hydration, 64 ounces of clear fluids daily. Minimize alcohol and caffeine. Eat small frequent meals with lean proteins and complex carbs. Avoid high and low blood sugars. Get adequate sleep, 7-8 hours a night. Needs exercise daily preferably in the morning.  

## 2016-10-04 NOTE — Assessment & Plan Note (Signed)
Increase leafy greens, consider increased lean red meat and using cast iron cookware. Continue to monitor, report any concerns 

## 2016-10-26 ENCOUNTER — Ambulatory Visit (INDEPENDENT_AMBULATORY_CARE_PROVIDER_SITE_OTHER): Payer: BC Managed Care – PPO | Admitting: Podiatry

## 2016-10-26 ENCOUNTER — Encounter: Payer: Self-pay | Admitting: Podiatry

## 2016-10-26 ENCOUNTER — Ambulatory Visit (INDEPENDENT_AMBULATORY_CARE_PROVIDER_SITE_OTHER): Payer: BC Managed Care – PPO

## 2016-10-26 VITALS — BP 115/90 | HR 73

## 2016-10-26 DIAGNOSIS — M2041 Other hammer toe(s) (acquired), right foot: Secondary | ICD-10-CM | POA: Diagnosis not present

## 2016-10-26 DIAGNOSIS — M779 Enthesopathy, unspecified: Secondary | ICD-10-CM

## 2016-10-26 DIAGNOSIS — M79671 Pain in right foot: Secondary | ICD-10-CM

## 2016-10-26 DIAGNOSIS — M21619 Bunion of unspecified foot: Secondary | ICD-10-CM | POA: Diagnosis not present

## 2016-10-26 DIAGNOSIS — M7751 Other enthesopathy of right foot: Secondary | ICD-10-CM | POA: Diagnosis not present

## 2016-10-26 MED ORDER — TRIAMCINOLONE ACETONIDE 10 MG/ML IJ SUSP
10.0000 mg | Freq: Once | INTRAMUSCULAR | Status: AC
  Administered 2016-10-26: 10 mg

## 2016-10-26 NOTE — Progress Notes (Signed)
   Subjective:    Patient ID: [name], female    DOB: March 03, 1966, 50 y.o.   MRN: [id number]  HPI    Review of Systems  Neurological: Positive for headaches.  Hematological: Bruises/bleeds easily.  All other systems reviewed and are negative.      Objective:   Physical Exam        Assessment & Plan:

## 2016-10-29 NOTE — Progress Notes (Signed)
Subjective:     Patient ID: [name], female   DOB: 11-14-1966, 50 y.o.   MRN: [id number]  HPI patient states she's developed a lesion between the right hallux and second toe that become painful and makes shoe gear difficult   Review of Systems  All other systems reviewed and are negative.      Objective:   Physical Exam  Constitutional: She is oriented to person, place, and time.  Cardiovascular: Intact distal pulses.   Musculoskeletal: Normal range of motion.  Neurological: She is oriented to person, place, and time.  Skin: Skin is warm.  Nursing note and vitals reviewed.  neurovascular status found to be intact muscle strength adequate range of motion within normal limits with patient noted to have interphalangeal joint corn digit 2 right with a drifting of the hallux against the second toe and small keratotic lesion on the big toe right foot with both being painful but the second toe being worse. There is fluid buildup of several months duration and patient was noted to have good digital perfusion and is well oriented 3     Assessment:     Inflammatory changes right second digit interphalangeal joint with pain and fluid buildup along with structural deformity between the hallux and second toe    Plan:     H&P x-rays reviewed condition discussed. I have recommended that we take a conservative approach and if this does not resolve it may require surgical intervention at one point. I did a careful interphalangeal joint injection of the right second toe at the capsular and joint medial side debrided tissue on the second toe big toe and apply cushioning to take pressure off the adjacent toes. Reappoint as symptoms indicate  X-ray report indicates there is mild structural deformity with an abutment of the hallux against the second toe

## 2016-11-09 ENCOUNTER — Other Ambulatory Visit (INDEPENDENT_AMBULATORY_CARE_PROVIDER_SITE_OTHER): Payer: BC Managed Care – PPO

## 2016-11-09 DIAGNOSIS — E782 Mixed hyperlipidemia: Secondary | ICD-10-CM

## 2016-11-09 DIAGNOSIS — R739 Hyperglycemia, unspecified: Secondary | ICD-10-CM | POA: Diagnosis not present

## 2016-11-10 LAB — LIPID PANEL
CHOL/HDL RATIO: 4
Cholesterol: 201 mg/dL — ABNORMAL HIGH (ref 0–200)
HDL: 55.3 mg/dL (ref >39.00)
LDL Cholesterol: 130 mg/dL — ABNORMAL HIGH (ref 0–99)
NONHDL: 145.54
Triglycerides: 79 mg/dL (ref 0.0–149.0)
VLDL: 15.8 mg/dL (ref 0.0–40.0)

## 2016-11-10 LAB — COMPREHENSIVE METABOLIC PANEL
ALT: 12 U/L (ref 0–35)
AST: 19 U/L (ref 0–37)
Albumin: 4.2 g/dL (ref 3.5–5.2)
Alkaline Phosphatase: 65 U/L (ref 39–117)
BILIRUBIN TOTAL: 0.3 mg/dL (ref 0.2–1.2)
BUN: 15 mg/dL (ref 6–23)
CO2: 28 meq/L (ref 19–32)
Calcium: 9 mg/dL (ref 8.4–10.5)
Chloride: 103 mEq/L (ref 96–112)
Creatinine, Ser: 0.96 mg/dL (ref 0.40–1.20)
GFR: 79.31 mL/min (ref >60.00)
GLUCOSE: 82 mg/dL (ref 70–99)
Potassium: 4 mEq/L (ref 3.5–5.1)
SODIUM: 138 meq/L (ref 135–145)
Total Protein: 7 g/dL (ref 6.0–8.3)

## 2016-11-10 LAB — HEMOGLOBIN A1C: Hgb A1c MFr Bld: 5.9 % (ref 4.6–6.5)

## 2017-02-22 ENCOUNTER — Other Ambulatory Visit: Payer: Self-pay | Admitting: Family Medicine

## 2017-02-22 DIAGNOSIS — Z1231 Encounter for screening mammogram for malignant neoplasm of breast: Secondary | ICD-10-CM

## 2017-03-24 ENCOUNTER — Ambulatory Visit (INDEPENDENT_AMBULATORY_CARE_PROVIDER_SITE_OTHER): Payer: BC Managed Care – PPO | Admitting: Family Medicine

## 2017-03-24 ENCOUNTER — Encounter: Payer: Self-pay | Admitting: Family Medicine

## 2017-03-24 VITALS — BP 110/78 | HR 70 | Temp 99.0°F | Resp 16 | Ht 63.0 in | Wt 176.0 lb

## 2017-03-24 DIAGNOSIS — J029 Acute pharyngitis, unspecified: Secondary | ICD-10-CM

## 2017-03-24 LAB — POCT RAPID STREP A (OFFICE): RAPID STREP A SCREEN: NEGATIVE

## 2017-03-24 MED FILL — NOW PROBIOTIC-10: 100 days supply | Qty: 100 | Fill #1

## 2017-03-24 NOTE — Patient Instructions (Addendum)
If you start having fevers or worsening pain, let us know.   OK to take Tylenol 1000 mg (2 extra strength tabs) or 975 mg (3 regular strength tabs) every 6 hours as needed.  Ibuprofen 400-600 mg (2-3 over the counter strength tabs) every 6 hours as needed for pain.  Claritin (loratadine), Allegra (fexofenadine), Zyrtec (cetirizine); these are listed in order from weakest to strongest. Generic, and therefore cheaper, options are in the parentheses.   Flonase (fluticasone); nasal spray that is over the counter. 2 sprays each nostril, once daily. Aim towards the same side eye when you spray.  There are available OTC, and the generic versions, which may be cheaper, are in parentheses. Show this to a pharmacist if you have trouble finding any of these items.

## 2017-03-24 NOTE — Progress Notes (Signed)
Pre visit review using our clinic review tool, if applicable. No additional management support is needed unless otherwise documented below in the visit note. 

## 2017-03-24 NOTE — Progress Notes (Signed)
SUBJECTIVE:   [name] is a 51 y.o. female presents to the clinic for:  Chief Complaint  Patient presents with  . 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  Other associated symptoms: sinus congestion.  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 Never Smoker  Smokeless Tobacco  . Never Used    ROS: Pertinent items are noted in HPI  Patient's medications, allergies, past medical, surgical, social and family histories were reviewed and updated as appropriate.  OBJECTIVE:  BP 110/78 (BP Location: Left Arm, Cuff Size: Normal)   Pulse 70   Temp 99 F (37.2 C) (Oral)   Resp 16   Ht 5\' 3"  (1.6 m)   Wt 176 lb (79.8 kg)   LMP  (LMP Unknown) Comment: AUB  SpO2 98%   BMI 31.18 kg/m  General: Awake, alert, appearing stated age Eyes: conjunctivae and sclerae clear Ears: normal TMs bilaterally Nose: no visible exudate, +enlarged turbinate on the L Oropharynx: lips, mucosa, and tongue normal; teeth and gums normal, pharynx pink without erythema or exudates Neck: supple, no significant adenopathy Lungs: clear to auscultation, no wheezes, rales or rhonchi, symmetric air entry, normal effort Heart: rate and rhythm regular Psych: Age appropriate judgment and insight  ASSESSMENT/PLAN:  Sore throat - Plan: POCT rapid strep A  Rapid strep neg.  Based on hx and exam, likely more related to allergies and PND. Pt will start taking Zyrtec again. Ibuprofen and acetaminophen for pain prn. F/u prn. Pt voiced understanding and agreement to the plan.  [place], DO 03/24/17 3:45 PM

## 2017-03-25 ENCOUNTER — Ambulatory Visit
Admission: RE | Admit: 2017-03-25 | Discharge: 2017-03-25 | Disposition: A | Discharge status: Home / Self Care | Payer: BC Managed Care – PPO | Source: Ambulatory Visit | Attending: Family Medicine | Admitting: Family Medicine

## 2017-03-25 DIAGNOSIS — Z1231 Encounter for screening mammogram for malignant neoplasm of breast: Secondary | ICD-10-CM

## 2017-06-08 ENCOUNTER — Ambulatory Visit (INDEPENDENT_AMBULATORY_CARE_PROVIDER_SITE_OTHER): Payer: BC Managed Care – PPO | Admitting: Podiatry

## 2017-06-08 ENCOUNTER — Encounter: Payer: Self-pay | Admitting: Podiatry

## 2017-06-08 VITALS — BP 122/81 | HR 74

## 2017-06-08 DIAGNOSIS — L84 Corns and callosities: Secondary | ICD-10-CM | POA: Diagnosis not present

## 2017-06-08 NOTE — Progress Notes (Signed)
   Subjective:    Patient ID: [name], female    DOB: 26-May-1966, 51 y.o.   MRN: [id number]  HPI This patient presents again today complaining of painful skin lesions on the right hallux and second right toe becoming progressively more uncomfortable past several months. These areas are comfortable when wearing tighter shoes with removal shoe and attempted self treatment with topical corn removers With minimal improvement patient was evaluated 10/26/2016 and at that time a local injection was given in the second right toe with protective padding. This provided some temporary relief of symptoms   Review of Systems  All other systems reviewed and are negative.      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

## 2017-06-08 NOTE — Patient Instructions (Signed)
There are 2 friction corns caused by the great toe rubbing up against the second toe. Also, these second toe has a curve and it which further which pressure on these areas. These corns are chronic. Treatment could include repetitive debridement and a protective shield. If conservative treatment does not provide relief surgical treatment could be an option. Return as needed

## 2018-02-24 ENCOUNTER — Other Ambulatory Visit: Payer: Self-pay | Admitting: Obstetrics and Gynecology

## 2018-02-24 DIAGNOSIS — Z1231 Encounter for screening mammogram for malignant neoplasm of breast: Secondary | ICD-10-CM

## 2018-03-28 ENCOUNTER — Ambulatory Visit: Payer: BC Managed Care – PPO

## 2018-04-13 ENCOUNTER — Ambulatory Visit: Payer: BC Managed Care – PPO

## 2018-04-15 ENCOUNTER — Ambulatory Visit
Admission: RE | Admit: 2018-04-15 | Discharge: 2018-04-15 | Disposition: A | Discharge status: Home / Self Care | Payer: BC Managed Care – PPO | Source: Ambulatory Visit | Attending: Obstetrics and Gynecology | Admitting: Obstetrics and Gynecology

## 2018-04-15 DIAGNOSIS — Z1231 Encounter for screening mammogram for malignant neoplasm of breast: Secondary | ICD-10-CM

## 2018-05-13 LAB — HM COLONOSCOPY

## 2018-05-20 ENCOUNTER — Encounter: Payer: Self-pay | Admitting: Family Medicine

## 2018-07-03 ENCOUNTER — Other Ambulatory Visit: Payer: Self-pay | Admitting: Oncology

## 2018-09-06 ENCOUNTER — Encounter: Payer: Self-pay | Admitting: Internal Medicine

## 2018-09-06 ENCOUNTER — Ambulatory Visit: Payer: BC Managed Care – PPO | Admitting: Internal Medicine

## 2018-09-06 VITALS — BP 116/80 | HR 68 | Temp 98.1°F | Resp 16 | Ht 63.0 in | Wt 175.0 lb

## 2018-09-06 DIAGNOSIS — J069 Acute upper respiratory infection, unspecified: Secondary | ICD-10-CM | POA: Diagnosis not present

## 2018-09-06 MED ORDER — AMOXICILLIN 875 MG PO TABS: 875.0000 mg | ORAL_TABLET | Freq: Two times a day (BID) | ORAL | 0 refills | Status: DC

## 2018-09-06 MED ORDER — AZELASTINE HCL 0.1 % NA SOLN: 2.0000 | Freq: Every evening | NASAL | 3 refills | Status: DC | PRN

## 2018-09-06 MED FILL — AZELASTINE HCL 137 MCG/SPRA: 137 | 50 days supply | Qty: 30 | Fill #0

## 2018-09-06 NOTE — Progress Notes (Signed)
Subjective:    Patient ID: [name], female    DOB: 02-12-1966, 52 y.o.   MRN: [id number]  DOS:  09/06/2018 Type of visit - description : acute Interval history: Symptoms started 4 to 5 days ago with sore throat, on and off hoarseness, clear nasal discharge, mild chest congestion. Usually has some mucus pooled in the throat and make her cough. Zyrtec is not helping   Review of Systems Denies fever chills No nausea, vomiting or unusual aches or pains. 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  Past Medical History:  Diagnosis Date  . Allergic state 08/05/2014  . Allergy   . Anal bleeding   . Anemia   . Blood in stool   . Constipation 01/09/2015  . Dermatitis due to food taken internally    rast to milk--class 2  . Easy bruising   . GERD (gastroesophageal reflux disease)   . Hemorrhoids   . Hypercholesteremia   . Migraine headache   . Nasal congestion     Past Surgical History:  Procedure Laterality Date  . EYE SURGERY Bilateral    lasik  . HEMORRHOID SURGERY  06/19/2012   Dr. Fanny Skates  . MYOMECTOMY  04/2009   Dr. Dellis Filbert  . ROBOTIC ASSISTED TOTAL HYSTERECTOMY WITH SALPINGECTOMY Bilateral 11/07/2015   Procedure: ROBOTIC ASSISTED TOTAL HYSTERECTOMY WITH BILATERAL SALPINGECTOMY;  Surgeon: Servando Salina, MD;  Location: [facility];  Service: Gynecology;  Laterality: Bilateral;    Social History   Socioeconomic History  . Marital status: Married    Spouse name: spencer  . Number of children: 0  . Years of education: Not on file  . Highest education level: Not on file  Occupational History  . Occupation: Pharmacist, hospital at [employer], in [place]  Social Needs  . Financial resource strain: Not on file  . Food insecurity:    Worry: Not on file    Inability: Not on file  . Transportation needs:    Medical: Not on file    Non-medical: Not on file  Tobacco Use  . Smoking status: Never Smoker  . Smokeless tobacco: Never Used  Substance  and Sexual Activity  . Alcohol use: Yes    Comment: socially - sometimes  . Drug use: No  . 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 Physical activity:    Days per week: Not on file    Minutes per session: Not on file  . Stress: Not on file  Relationships  . Social connections:    Talks on phone: Not on file    Gets together: Not on file    Attends religious service: Not on file    Active member of club or organization: Not on file    Attends meetings of clubs or organizations: Not on file    Relationship status: Not on file  . Intimate partner violence:    Fear of current or ex partner: Not on file    Emotionally abused: Not on file    Physically abused: Not on file    Forced sexual activity: Not on file  Other Topics Concern  . Not on file  Social History Narrative  . Not on file      Allergies as of 09/06/2018   No Known Allergies     Medication List        Accurate  as of 09/06/18 11:59 PM. Always use your most recent med list.          amoxicillin 875 MG tablet Commonly known as:  AMOXIL Take 1 tablet (875 mg total) by mouth 2 (two) times daily.   azelastine 0.1 % nasal spray Commonly known as:  ASTELIN Place 2 sprays into both nostrils at bedtime as needed for rhinitis. Use in each nostril as directed   multivitamin tablet Take 1 tablet by mouth daily.   PROBIOTIC DAILY Caps Take 1 capsule by mouth daily. NOW probiotics   vitamin E 600 UNIT capsule Take 600 Units by mouth daily.          Objective:   Physical Exam BP 116/80 (BP Location: Right Arm, Patient Position: Sitting, Cuff Size: Small)   Pulse 68   Temp 98.1 F (36.7 C) (Oral)   Resp 16   Ht 5\' 3"  (1.6 m)   Wt 175 lb (79.4 kg)   LMP  (LMP Unknown) Comment: AUB  SpO2 97%   BMI 31.00 kg/m  General:   Well developed, NAD, see BMI.  HEENT:  Normocephalic . Face symmetric,  atraumatic, nose congested.  Sinuses no TTP.  TMs normal.  Throat symmetric, no red Lungs:  CTA B Normal respiratory effort, no intercostal retractions, no accessory muscle use. Heart: RRR,  no murmur.  No pretibial edema bilaterally  Skin: Not pale. Not jaundice Neurologic:  alert & oriented X3.  Speech normal, gait appropriate for age and unassisted Psych--  Cognition and judgment appear intact.  Cooperative with normal attention span and concentration.  Behavior appropriate. No anxious or depressed appearing.      Assessment & Plan:    52 year old female, PMH includes migraines, high cholesterol, hysterectomy, presents with:  URI: Conservative treatment for few days, see AVS, amoxicillin if not better in 4 to 5 days.

## 2018-09-06 NOTE — Progress Notes (Signed)
Pre visit review using our clinic review tool, if applicable. No additional management support is needed unless otherwise documented below in the visit note. 

## 2018-09-06 NOTE — Patient Instructions (Signed)
Rest, fluids , tylenol  For cough:  Take Mucinex DM twice a day as needed until better  For nasal congestion: Use OTC  Flonase : 2 nasal sprays on each side of the nose in the morning until you feel better Use ASTELIN a prescribed spray : 2 nasal sprays on each side of the nose at night until you feel better  Take the antibiotic as prescribed  (Amoxicillin) if no better in 4-5 days   Call if not gradually better over the next  10 days  Call anytime if the symptoms are severe

## 2019-01-02 DIAGNOSIS — G43009 Migraine without aura, not intractable, without status migrainosus: Secondary | ICD-10-CM | POA: Insufficient documentation

## 2019-03-16 ENCOUNTER — Other Ambulatory Visit: Payer: Self-pay | Admitting: Family Medicine

## 2019-03-16 ENCOUNTER — Other Ambulatory Visit: Payer: Self-pay | Admitting: Obstetrics and Gynecology

## 2019-03-16 DIAGNOSIS — Z1231 Encounter for screening mammogram for malignant neoplasm of breast: Secondary | ICD-10-CM

## 2019-05-10 ENCOUNTER — Ambulatory Visit: Payer: BC Managed Care – PPO

## 2019-05-13 ENCOUNTER — Ambulatory Visit
Admission: RE | Admit: 2019-05-13 | Discharge: 2019-05-13 | Disposition: A | Discharge status: Home / Self Care | Payer: BC Managed Care – PPO | Source: Ambulatory Visit | Attending: Family Medicine | Admitting: Family Medicine

## 2019-05-13 ENCOUNTER — Other Ambulatory Visit: Payer: Self-pay

## 2019-05-13 DIAGNOSIS — Z1231 Encounter for screening mammogram for malignant neoplasm of breast: Secondary | ICD-10-CM

## 2019-06-22 DIAGNOSIS — E0590 Thyrotoxicosis, unspecified without thyrotoxic crisis or storm: Secondary | ICD-10-CM | POA: Insufficient documentation

## 2020-06-25 ENCOUNTER — Other Ambulatory Visit: Payer: Self-pay | Admitting: Family Medicine

## 2020-06-25 DIAGNOSIS — Z1231 Encounter for screening mammogram for malignant neoplasm of breast: Secondary | ICD-10-CM

## 2020-06-26 ENCOUNTER — Ambulatory Visit
Admission: RE | Admit: 2020-06-26 | Discharge: 2020-06-26 | Disposition: A | Discharge status: Home / Self Care | Payer: BC Managed Care – PPO | Source: Ambulatory Visit | Attending: Family Medicine | Admitting: Family Medicine

## 2020-06-26 ENCOUNTER — Other Ambulatory Visit: Payer: Self-pay

## 2020-06-26 DIAGNOSIS — Z1231 Encounter for screening mammogram for malignant neoplasm of breast: Secondary | ICD-10-CM

## 2020-09-30 ENCOUNTER — Telehealth: Payer: Self-pay | Admitting: *Deleted

## 2020-09-30 NOTE — Telephone Encounter (Signed)
Pt with questions regarding booster. Is filling out form online. Was exposed to positive co-worker last week of October. Per school protocol was not required to quarantine. Pt's appt is for 10/05/20. Will be 14 days since exposure. Advised to keep appt for booster unless symptoms arise. If symptoms, covid test. Verbalizes understanding.

## 2020-10-05 ENCOUNTER — Ambulatory Visit: Payer: BC Managed Care – PPO

## 2020-10-05 ENCOUNTER — Other Ambulatory Visit: Payer: Self-pay

## 2020-10-05 ENCOUNTER — Ambulatory Visit: Payer: BC Managed Care – PPO | Attending: Internal Medicine

## 2020-10-05 DIAGNOSIS — Z23 Encounter for immunization: Secondary | ICD-10-CM

## 2020-10-05 NOTE — Progress Notes (Signed)
   Covid-19 Vaccination Clinic  Name:  [name]    MRN: [id number] DOB: Jan 19, 1966  10/05/2020  [name] was observed post Covid-19 immunization for 15 minutes without incident. She was provided with Vaccine Information Sheet and instruction to access the V-Safe system.   [name] was instructed to call 911 with any severe reactions post vaccine: Marland Kitchen Difficulty breathing  . Swelling of face and throat  . A fast heartbeat  . A bad rash all over body  . Dizziness and weakness   Immunizations Administered    No immunizations on file.

## 2021-04-07 ENCOUNTER — Other Ambulatory Visit: Payer: Self-pay | Admitting: Internal Medicine

## 2021-04-07 DIAGNOSIS — Z1231 Encounter for screening mammogram for malignant neoplasm of breast: Secondary | ICD-10-CM

## 2021-05-18 ENCOUNTER — Telehealth: Payer: BC Managed Care – PPO | Admitting: Emergency Medicine

## 2021-05-18 ENCOUNTER — Encounter: Payer: Self-pay | Admitting: Emergency Medicine

## 2021-05-18 DIAGNOSIS — L247 Irritant contact dermatitis due to plants, except food: Secondary | ICD-10-CM | POA: Diagnosis not present

## 2021-05-18 DIAGNOSIS — L03113 Cellulitis of right upper limb: Secondary | ICD-10-CM | POA: Diagnosis not present

## 2021-05-18 MED ORDER — PREDNISONE 5 MG PO TABS: ORAL_TABLET | ORAL | 0 refills | Status: DC

## 2021-05-18 MED ORDER — FLUCONAZOLE 150 MG PO TABS
150.0000 mg | ORAL_TABLET | Freq: Once | ORAL | 0 refills | Status: AC
Start: 2021-05-18 — End: 2021-05-18

## 2021-05-18 MED ORDER — CEPHALEXIN 500 MG PO CAPS: 500.0000 mg | ORAL_CAPSULE | Freq: Two times a day (BID) | ORAL | 0 refills | Status: AC

## 2021-05-18 MED ORDER — TRIAMCINOLONE ACETONIDE 0.1 % EX CREA
1.0000 | TOPICAL_CREAM | Freq: Two times a day (BID) | CUTANEOUS | 0 refills | Status: DC
Start: 2021-05-18 — End: 2023-06-04

## 2021-05-18 NOTE — Progress Notes (Signed)
[name] are scheduled for a virtual visit with your provider today.    Just as we do with appointments in the office, we must obtain your consent to participate.  Your consent will be active for this visit and any virtual visit you may have with one of our providers in the next 365 days.    If you have a MyChart account, I can also send a copy of this consent to you electronically.  All virtual visits are billed to your insurance company just like a traditional visit in the office.  As this is a virtual visit, video technology does not allow for your provider to perform a traditional examination.  This may limit your provider's ability to fully assess your condition.  If your provider identifies any concerns that need to be evaluated in person or the need to arrange testing such as labs, EKG, etc, we will make arrangements to do so.    Although advances in technology are sophisticated, we cannot ensure that it will always work on either your end or our end.  If the connection with a video visit is poor, we may have to switch to a telephone visit.  With either a video or telephone visit, we are not always able to ensure that we have a secure connection.   I need to obtain your verbal consent now.   Are you willing to proceed with your visit today?   [name] has provided verbal consent on 05/18/2021 for a virtual visit (video or telephone).   Noe Gens, PA-C 05/18/2021  12:49 PM   Date:  05/18/2021   ID:  [name], DOB 08/05/1966, MRN [id number]  Patient Location: Home Provider Location: Home Office   Participants: Patient and Provider for Visit and Wrap up  Method of visit: Video  Location of Patient: Home Location of Provider: Home Office Consent was obtain for visit over the video. Services rendered by provider: Visit was performed via video  A video enabled telemedicine application was used and I verified that I am speaking with the correct person using two  identifiers.  PCP:  Patient, No Pcp Per (Inactive)   Chief Complaint:  poison ivy rash  History of Present Illness:    [name] is a 55 y.o. female with history as stated below. Presents video telehealth for an acute care visit  Onset of symptoms was 2 days ago and symptoms have been persistent and include: blistering, redness, swelling, itching, burning, and aching of bilateral forearms, especially right wrist.  Symptoms started after she was working outside in her yard with some bushes.  Hx of mild rashes from poison ivy in the past, controlled with OTC medications but current rash has not improved with OTC hydrocortisone or Xyzal.    Denies having fevers, chills, or body aches. No oral swelling or trouble breathing.    No other aggravating or relieving factors.  No other c/o.  Past Medical, Surgical, Social History, Allergies, and Medications have been Reviewed.  Patient Active Problem List   Diagnosis Date Noted   S/P hysterectomy 11/07/2015   Constipation 01/09/2015   Other malaise and fatigue 08/05/2014   Iron deficiency anemia 08/05/2014   Allergic state 08/05/2014   Preventative health care 08/05/2014   Annual physical exam 12/29/2011   HEMORRHOIDS 12/30/2010   DERMATITIS DUE TO FOOD TAKEN INTERNALLY 12/05/2010   HYPERCHOLESTEROLEMIA 11/21/2008   ANEMIA 06/01/2008   Migraine 06/01/2008    Social History   Tobacco Use  Smoking status: Never   Smokeless tobacco: Never  Substance Use Topics   Alcohol use: Yes    Comment: socially - sometimes     Current Outpatient Medications:    cephALEXin (KEFLEX) 500 MG capsule, Take 1 capsule (500 mg total) by mouth 2 (two) times daily for 7 days., Disp: 14 capsule, Rfl: 0   fluconazole (DIFLUCAN) 150 MG tablet, Take 1 tablet (150 mg total) by mouth once for 1 dose., Disp: 1 tablet, Rfl: 0   predniSONE (DELTASONE) 5 MG tablet, Day 1: take 6 tablets  Day 2: take 5 tablets  Day 3: take 4 tablets  Day 4: take 3 tablets   Day 5: take 2 tablets  Day 6:  Take 1 tablet, Disp: 21 tablet, Rfl: 0   triamcinolone cream (KENALOG) 0.1 %, Apply 1 application topically 2 (two) times daily., Disp: 30 g, Rfl: 0   azelastine (ASTELIN) 0.1 % nasal spray, Place 2 sprays into both nostrils at bedtime as needed for rhinitis. Use in each nostril as directed, Disp: 30 mL, Rfl: 3   Multiple Vitamin (MULTIVITAMIN) tablet, Take 1 tablet by mouth daily., Disp: , Rfl:    Probiotic Product (PROBIOTIC DAILY) CAPS, Take 1 capsule by mouth daily. NOW probiotics (Patient not taking: Reported on 09/06/2018), Disp: 100 capsule, Rfl: 3   vitamin E 600 UNIT capsule, Take 600 Units by mouth daily., Disp: , Rfl:    No Known Allergies   ROS See HPI for history of present illness.  Physical Exam Constitutional:      Appearance: Normal appearance.     Comments: Resting comfortably at home. No acute distress. Alert and cooperative during video visit.   HENT:     Head: Normocephalic.  Eyes:     Extraocular Movements: Extraocular movements intact.  Pulmonary:     Effort: Pulmonary effort is normal. No respiratory distress.  Musculoskeletal:     Cervical back: Normal range of motion.  Skin:    Findings: Erythema and rash present.     Comments: Right forearm: erythema, multiple blisters and small bullae. Video quality poor- fluid appears clear. Mild tenderness over the rash per pt  Neurological:     Mental Status: She is alert.  Psychiatric:        Mood and Affect: Mood normal.        Behavior: Behavior normal.              A&P  Contact dermatitis due to plants Cellulitis   Due to associated pain with rash and reported swelling around rash, will cover for secondary bacterial infection. Cephalexin 500mg  twice daily for 7 days.  Pt reports hx of yeast infections after antibiotics. Diflucan 150mg  tap prescribed to hold in case of yeast infection.   Discussed home care with AVS sent to patient's MyChart account.  Encouraged in-person  follow up in 3-4 days if not improving, sooner if worsening.    Patient voiced understanding and agreement to plan.   Time:   Today, I have spent 15 minutes with the patient with telehealth technology discussing the above problems, reviewing the chart, previous notes, medications and orders.    Tests Ordered: No orders of the defined types were placed in this encounter.   Medication Changes: Meds ordered this encounter  Medications   predniSONE (DELTASONE) 5 MG tablet    Sig: Day 1: take 6 tablets  Day 2: take 5 tablets  Day 3: take 4 tablets  Day 4: take 3 tablets  Day 5:  take 2 tablets  Day 6:  Take 1 tablet    Dispense:  21 tablet    Refill:  0   cephALEXin (KEFLEX) 500 MG capsule    Sig: Take 1 capsule (500 mg total) by mouth 2 (two) times daily for 7 days.    Dispense:  14 capsule    Refill:  0   fluconazole (DIFLUCAN) 150 MG tablet    Sig: Take 1 tablet (150 mg total) by mouth once for 1 dose.    Dispense:  1 tablet    Refill:  0    Void after 06/17/2021   triamcinolone cream (KENALOG) 0.1 %    Sig: Apply 1 application topically 2 (two) times daily.    Dispense:  30 g    Refill:  0     Disposition:  Follow up with PCP or UC as needed.  Etta Grandchild, PA-C  05/18/2021 12:49 PM

## 2021-06-05 ENCOUNTER — Other Ambulatory Visit (HOSPITAL_BASED_OUTPATIENT_CLINIC_OR_DEPARTMENT_OTHER): Payer: Self-pay

## 2021-06-05 ENCOUNTER — Ambulatory Visit: Payer: BC Managed Care – PPO | Attending: Internal Medicine

## 2021-06-05 ENCOUNTER — Other Ambulatory Visit: Payer: Self-pay

## 2021-06-05 DIAGNOSIS — Z23 Encounter for immunization: Secondary | ICD-10-CM

## 2021-06-05 MED ORDER — PFIZER-BIONT COVID-19 VAC-TRIS 30 MCG/0.3ML IM SUSP
INTRAMUSCULAR | 0 refills | Status: DC
  Filled 2021-06-05: qty 0.3, 1d supply, fill #0

## 2021-06-05 NOTE — Progress Notes (Signed)
   Covid-19 Vaccination Clinic  Name:  [name]    MRN: [id number] DOB: 1966-03-26  06/05/2021  [name] was observed post Covid-19 immunization for 15 minutes without incident. She was provided with Vaccine Information Sheet and instruction to access the V-Safe system.   [name] was instructed to call 911 with any severe reactions post vaccine: Difficulty breathing  Swelling of face and throat  A fast heartbeat  A bad rash all over body  Dizziness and weakness   Immunizations Administered     Name Date Dose VIS Date Route   PFIZER Comrnaty(Gray TOP) Covid-19 Vaccine 06/05/2021  9:34 AM 0.3 mL 10/31/2020 Intramuscular   Manufacturer: [place]   Lot: [id number]   [place]: [phone]

## 2021-06-30 ENCOUNTER — Other Ambulatory Visit: Payer: Self-pay

## 2021-06-30 ENCOUNTER — Ambulatory Visit
Admission: RE | Admit: 2021-06-30 | Discharge: 2021-06-30 | Disposition: A | Discharge status: Home / Self Care | Payer: BC Managed Care – PPO | Source: Ambulatory Visit | Attending: Internal Medicine | Admitting: Internal Medicine

## 2021-06-30 DIAGNOSIS — Z1231 Encounter for screening mammogram for malignant neoplasm of breast: Secondary | ICD-10-CM

## 2022-04-22 ENCOUNTER — Other Ambulatory Visit: Payer: Self-pay | Admitting: Internal Medicine

## 2022-04-22 DIAGNOSIS — Z1231 Encounter for screening mammogram for malignant neoplasm of breast: Secondary | ICD-10-CM

## 2022-07-01 ENCOUNTER — Ambulatory Visit: Payer: BC Managed Care – PPO

## 2022-07-06 ENCOUNTER — Ambulatory Visit
Admission: RE | Admit: 2022-07-06 | Discharge: 2022-07-06 | Disposition: A | Discharge status: Home / Self Care | Payer: BC Managed Care – PPO | Source: Ambulatory Visit | Attending: Internal Medicine | Admitting: Internal Medicine

## 2022-07-06 DIAGNOSIS — Z1231 Encounter for screening mammogram for malignant neoplasm of breast: Secondary | ICD-10-CM

## 2022-07-14 ENCOUNTER — Other Ambulatory Visit (HOSPITAL_COMMUNITY): Payer: Self-pay | Admitting: Gastroenterology

## 2022-07-14 DIAGNOSIS — R933 Abnormal findings on diagnostic imaging of other parts of digestive tract: Secondary | ICD-10-CM

## 2022-07-16 ENCOUNTER — Ambulatory Visit (HOSPITAL_COMMUNITY)
Admission: RE | Admit: 2022-07-16 | Discharge: 2022-07-16 | Disposition: A | Discharge status: Home / Self Care | Payer: BC Managed Care – PPO | Source: Ambulatory Visit | Attending: Gastroenterology | Admitting: Gastroenterology

## 2022-07-16 DIAGNOSIS — R933 Abnormal findings on diagnostic imaging of other parts of digestive tract: Secondary | ICD-10-CM | POA: Diagnosis present

## 2022-07-16 MED ORDER — IOHEXOL 300 MG/ML  SOLN
100.0000 mL | Freq: Once | INTRAMUSCULAR | Status: AC | PRN
  Administered 2022-07-16: 100 mL via INTRAVENOUS

## 2022-07-16 MED ORDER — SODIUM CHLORIDE (PF) 0.9 % IJ SOLN
INTRAMUSCULAR | Status: AC
  Filled 2022-07-16: qty 50

## 2022-08-03 ENCOUNTER — Telehealth: Payer: Self-pay | Admitting: Hematology and Oncology

## 2022-08-03 NOTE — Telephone Encounter (Signed)
Scheduled appt per 9/5 referral. Pt is aware of appt date and time. Pt is aware to arrive 15 mins prior to appt time and to bring and updated insurance card. Pt is aware of appt location.   

## 2022-08-31 ENCOUNTER — Inpatient Hospital Stay: Payer: BC Managed Care – PPO

## 2022-08-31 ENCOUNTER — Inpatient Hospital Stay: Payer: BC Managed Care – PPO | Attending: Hematology and Oncology | Admitting: Hematology and Oncology

## 2022-08-31 ENCOUNTER — Other Ambulatory Visit: Payer: Self-pay

## 2022-08-31 ENCOUNTER — Telehealth: Payer: Self-pay | Admitting: Hematology and Oncology

## 2022-08-31 DIAGNOSIS — D649 Anemia, unspecified: Secondary | ICD-10-CM

## 2022-08-31 DIAGNOSIS — D539 Nutritional anemia, unspecified: Secondary | ICD-10-CM

## 2022-08-31 DIAGNOSIS — Z803 Family history of malignant neoplasm of breast: Secondary | ICD-10-CM | POA: Diagnosis not present

## 2022-08-31 DIAGNOSIS — Z90710 Acquired absence of both cervix and uterus: Secondary | ICD-10-CM | POA: Insufficient documentation

## 2022-08-31 DIAGNOSIS — Z808 Family history of malignant neoplasm of other organs or systems: Secondary | ICD-10-CM

## 2022-08-31 DIAGNOSIS — Z853 Personal history of malignant neoplasm of breast: Secondary | ICD-10-CM | POA: Diagnosis not present

## 2022-08-31 DIAGNOSIS — Z8639 Personal history of other endocrine, nutritional and metabolic disease: Secondary | ICD-10-CM | POA: Insufficient documentation

## 2022-08-31 DIAGNOSIS — E611 Iron deficiency: Secondary | ICD-10-CM | POA: Insufficient documentation

## 2022-08-31 DIAGNOSIS — Z8249 Family history of ischemic heart disease and other diseases of the circulatory system: Secondary | ICD-10-CM | POA: Insufficient documentation

## 2022-08-31 LAB — CBC WITH DIFFERENTIAL (CANCER CENTER ONLY)
Abs Immature Granulocytes: 0.01 10*3/uL (ref 0.00–0.07)
Basophils Absolute: 0.1 10*3/uL (ref 0.0–0.1)
Basophils Relative: 1 %
Eosinophils Absolute: 0.3 10*3/uL (ref 0.0–0.5)
Eosinophils Relative: 3 %
HCT: 35.8 % — ABNORMAL LOW (ref 36.0–46.0)
Hemoglobin: 11.6 g/dL — ABNORMAL LOW (ref 12.0–15.0)
Immature Granulocytes: 0 %
Lymphocytes Relative: 35 %
Lymphs Abs: 2.8 10*3/uL (ref 0.7–4.0)
MCH: 28.9 pg (ref 26.0–34.0)
MCHC: 32.4 g/dL (ref 30.0–36.0)
MCV: 89.1 fL (ref 80.0–100.0)
Monocytes Absolute: 0.5 10*3/uL (ref 0.1–1.0)
Monocytes Relative: 7 %
Neutro Abs: 4.3 10*3/uL (ref 1.7–7.7)
Neutrophils Relative %: 54 %
Platelet Count: 236 10*3/uL (ref 150–400)
RBC: 4.02 MIL/uL (ref 3.87–5.11)
RDW: 13.7 % (ref 11.5–15.5)
WBC Count: 7.9 10*3/uL (ref 4.0–10.5)
nRBC: 0 % (ref 0.0–0.2)

## 2022-08-31 LAB — VITAMIN B12: Vitamin B-12: 306 pg/mL (ref 180–914)

## 2022-08-31 LAB — FOLATE: Folate: 11.7 ng/mL (ref >5.9)

## 2022-08-31 LAB — RETIC PANEL
Immature Retic Fract: 19.3 % — ABNORMAL HIGH (ref 2.3–15.9)
RBC.: 4.02 MIL/uL (ref 3.87–5.11)
Retic Count, Absolute: 71.2 10*3/uL (ref 19.0–186.0)
Retic Ct Pct: 1.8 % (ref 0.4–3.1)
Reticulocyte Hemoglobin: 31.9 pg (ref >27.9)

## 2022-08-31 LAB — LACTATE DEHYDROGENASE: LDH: 191 U/L (ref 98–192)

## 2022-08-31 NOTE — Telephone Encounter (Signed)
Scheduled appointment per 10/9 los. Left voicemail.

## 2022-08-31 NOTE — Progress Notes (Signed)
[facility] NOTE  Patient Care Team: Seward Carol, MD as PCP - General (Internal Medicine) Servando Salina, MD as Consulting Physician (Obstetrics and Gynecology) Karlene Lineman, DDS as Consulting Physician (Dentistry)  CHIEF COMPLAINTS/PURPOSE OF CONSULTATION:  Anemia evaluation  HISTORY OF PRESENTING ILLNESS:  [name] 56 y.o. female is here because of recent diagnosis of anemia.  Patient has had anemia going up which was related to iron deficiency from heavy menstrual cycles.  But she had a hysterectomy and no longer is iron deficient.  She had a routine blood work in May and June and she was found to have a hemoglobin of 10.5.  Iron studies were done which showed a borderline iron deficiency but because of her intolerance to oral iron therapy she could not take any iron replacement.  She had stool cards which were negative.  She had upper endoscopy and colonoscopy which did not reveal any bleeding.  She was referred to [place] for discussion of work-up of the anemia.  I reviewed her records extensively and collaborated the history with the patient.  MEDICAL HISTORY:  Past Medical History:  Diagnosis Date   Allergic state 08/05/2014   Allergy    Anal bleeding    Anemia    Blood in stool    Constipation 01/09/2015   Dermatitis due to food taken internally    rast to milk--class 2   Easy bruising    GERD (gastroesophageal reflux disease)    Hemorrhoids    Hypercholesteremia    Migraine headache    Nasal congestion     SURGICAL HISTORY: Past Surgical History:  Procedure Laterality Date   EYE SURGERY Bilateral    lasik   HEMORRHOID SURGERY  06/19/2012   Dr. Fanny Skates   MYOMECTOMY  04/2009   Dr. Dellis Filbert   ROBOTIC ASSISTED TOTAL HYSTERECTOMY WITH SALPINGECTOMY Bilateral 11/07/2015   Procedure: ROBOTIC ASSISTED TOTAL HYSTERECTOMY WITH BILATERAL SALPINGECTOMY;  Surgeon: Servando Salina, MD;  Location: [facility];  Service: Gynecology;  Laterality:  Bilateral;    SOCIAL HISTORY: Social History   Socioeconomic History   Marital status: Married    Spouse name: spencer   Number of children: 0   Years of education: Not on file   Highest education level: Not on file  Occupational History   Occupation: Pharmacist, hospital at [employer], in [place]  Tobacco Use   Smoking status: Never   Smokeless tobacco: Never  Substance and Sexual Activity   Alcohol use: Yes    Comment: socially - sometimes   Drug use: No   Sexual activity: Yes    Birth control/protection: I.U.D.    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  Other Topics Concern   Not on file  Social History Narrative   Not on file   Social Determinants of Health   Financial Resource Strain: Not on file  Food Insecurity: Not on file  Transportation Needs: Not on file  Physical Activity: Not on file  Stress: Not on file  Social Connections: Not on file  Intimate Partner Violence: Not on file    FAMILY HISTORY: Family History  Problem Relation Age of Onset   Stroke Father    Hypertension Father    Cancer Mother 23       breast/bone cancer   Heart disease Mother        CHF   Hypertension Mother    Hyperlipidemia Mother    Heart attack Mother    Stroke  Mother    Diabetes Mother        type 2   COPD Mother    Kidney disease Mother        stage 84   Migraines Sister    Allergies Sister        seasonal   Congestive Heart Failure Maternal Grandmother    Diabetes Maternal Grandmother        type 2   Hypertension Maternal Grandmother     ALLERGIES:  has No Known Allergies.  MEDICATIONS:  Current Outpatient Medications  Medication Sig Dispense Refill   azelastine (ASTELIN) 0.1 % nasal spray Place 2 sprays into both nostrils at bedtime as needed for rhinitis. Use in each nostril as directed 30 mL 3   COVID-19 mRNA Vac-TriS, Pfizer, (PFIZER-BIONT COVID-19 VAC-TRIS) SUSP injection Inject into the muscle. 0.3 mL 0   Multiple  Vitamin (MULTIVITAMIN) tablet Take 1 tablet by mouth daily.     predniSONE (DELTASONE) 5 MG tablet Day 1: take 6 tablets  Day 2: take 5 tablets  Day 3: take 4 tablets  Day 4: take 3 tablets  Day 5: take 2 tablets  Day 6:  Take 1 tablet 21 tablet 0   Probiotic Product (PROBIOTIC DAILY) CAPS Take 1 capsule by mouth daily. NOW probiotics (Patient not taking: Reported on 09/06/2018) 100 capsule 3   triamcinolone cream (KENALOG) 0.1 % Apply 1 application topically 2 (two) times daily. 30 g 0   vitamin E 600 UNIT capsule Take 600 Units by mouth daily.     No current facility-administered medications for this visit.    REVIEW OF SYSTEMS:   Constitutional: Denies fevers, chills or abnormal night sweats   All other systems were reviewed with the patient and are negative.  PHYSICAL EXAMINATION: ECOG PERFORMANCE STATUS: 1 - Symptomatic but completely ambulatory  Vitals:   08/31/22 1549  BP: (!) 148/70  Pulse: 85  Resp: 18  Temp: 97.7 F (36.5 C)   Filed Weights   08/31/22 1549  Weight: 187 lb 14.4 oz (85.2 kg)    GENERAL:alert, no distress and comfortable     LABORATORY DATA:  I have reviewed the data as listed Lab Results  Component Value Date   WBC 6.0 04/07/2016   HGB 11.2 (L) 04/07/2016   HCT 34.3 (L) 04/07/2016   MCV 84.9 04/07/2016   PLT 209.0 04/07/2016   Lab Results  Component Value Date   NA 138 11/09/2016   K 4.0 11/09/2016   CL 103 11/09/2016   CO2 28 11/09/2016    RADIOGRAPHIC STUDIES: I have personally reviewed the radiological reports and agreed with the findings in the report.  ASSESSMENT AND PLAN:  ANEMIA Lab review: 03/23/2022: Hemoglobin 11, MCV 84, RDW 14.7, platelets 244, WBC 9.5, creatinine 1.07, normal LFTs, TIBC 430, iron saturation 19%, ferritin 11 04/27/2022: Hemoglobin 10.5, MCV 85  History of hysterectomy Could not tolerate oral iron supplement due to constipation  Differential diagnosis: 1. Anemia due to chronic disease and inflammation 2.  anemia due to renal dysfunction 3. Combined B-12 and iron deficiency anemias 4. Hemolysis 5. Plasma cell disorders myeloma 6. Bone marrow dysfunction with MDS  Workup performed: 1. CBC with differential to evaluate the smear 2. LDH, reticulocyte count to evaluate hemolysis 3. SPEP 4. 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  Telephone visit in 1 week to discuss results   All questions were answered. The  patient knows to call the clinic with any problems, questions or concerns.    Harriette Ohara, MD 08/31/22

## 2022-08-31 NOTE — Assessment & Plan Note (Signed)
Lab review: 03/23/2022: Hemoglobin 11, MCV 84, RDW 14.7, platelets 244, WBC 9.5, creatinine 1.07, normal LFTs, TIBC 430, iron saturation 19%, ferritin 11 04/27/2022: Hemoglobin 10.5, MCV 85  History of hysterectomy Could not tolerate oral iron supplement due to constipation  Differential diagnosis: 1. Anemia due to chronic disease and inflammation 2. anemia due to renal dysfunction 3. Combined B-12 and iron deficiency anemias 4. Hemolysis 5. Hypothyroidism 6. Plasma cell disorders myeloma 7. Bone marrow dysfunction with MDS  Workup performed: 1. CBC with differential to evaluate the smear 2. Haptoglobin, LDH, reticulocyte count to evaluate hemolysis 3. TSH 4. SPEP 5. T-91 and folic acid levels

## 2022-09-01 LAB — IRON AND IRON BINDING CAPACITY (CC-WL,HP ONLY)
Iron: 54 ug/dL (ref 28–170)
Saturation Ratios: 14 % (ref 10.4–31.8)
TIBC: 377 ug/dL (ref 250–450)
UIBC: 323 ug/dL (ref 148–442)

## 2022-09-01 LAB — FERRITIN: Ferritin: 14 ng/mL (ref 11–307)

## 2022-09-01 LAB — ERYTHROPOIETIN: Erythropoietin: 19 m[IU]/mL — ABNORMAL HIGH (ref 2.6–18.5)

## 2022-09-04 LAB — MULTIPLE MYELOMA PANEL, SERUM
Albumin SerPl Elph-Mcnc: 3.8 g/dL (ref 2.9–4.4)
Albumin/Glob SerPl: 1.2 (ref 0.7–1.7)
Alpha 1: 0.3 g/dL (ref 0.0–0.4)
Alpha2 Glob SerPl Elph-Mcnc: 0.6 g/dL (ref 0.4–1.0)
B-Globulin SerPl Elph-Mcnc: 1.1 g/dL (ref 0.7–1.3)
Gamma Glob SerPl Elph-Mcnc: 1.3 g/dL (ref 0.4–1.8)
Globulin, Total: 3.4 g/dL (ref 2.2–3.9)
IgA: 260 mg/dL (ref 87–352)
IgG (Immunoglobin G), Serum: 1172 mg/dL (ref 586–1602)
IgM (Immunoglobulin M), Srm: 242 mg/dL — ABNORMAL HIGH (ref 26–217)
Total Protein ELP: 7.2 g/dL (ref 6.0–8.5)

## 2022-09-07 NOTE — Progress Notes (Signed)
HEMATOLOGY-ONCOLOGY TELEPHONE VISIT PROGRESS NOTE  I connected with our patient on 09/08/22 at  8:45 AM EDT by telephone and verified that I am speaking with the correct person using two identifiers.  I discussed the limitations, risks, security and privacy concerns of performing an evaluation and management service by telephone and the availability of in person appointments.  I also discussed with the patient that there may be a patient responsible charge related to this service. The patient expressed understanding and agreed to proceed.   History of Present Illness: [name] 56 y.o. female is here because of recent diagnosis of anemia. She presents to the clinic for a telephone follow-up.  She had extensive blood work for anemia and is connecting via telephone to discuss results.    REVIEW OF SYSTEMS:   Constitutional: Denies fevers, chills or abnormal weight loss All other systems were reviewed with the patient and are negative. Observations/Objective:     Assessment Plan:  ANEMIA Lab review: 03/23/2022: Hemoglobin 11, MCV 84, RDW 14.7, platelets 244, WBC 9.5, creatinine 1.07, normal LFTs, TIBC 430, iron saturation 19%, ferritin 11 04/27/2022: Hemoglobin 10.5, MCV 85 08/31/2022: Hemoglobin 11.6, MCV 89.1, SPEP: No M protein, reticulocyte's: Absolute: 71.2, 1.8%, reticulocyte hemoglobin: 31.9, LDH 191, folate 11.7, B12 306, iron saturation 14%, ferritin 14, erythropoietin 19  Based on the above lab results, patient has only mild anemia without any clear-cut identifiable lab abnormality.  It does appear that the bone marrow is functioning adequately.  Recommendation: Observation. Recheck labs in 3 months and telephone visit after that to discuss results    I discussed the assessment and treatment plan with the patient. The patient was provided an opportunity to ask questions and all were answered. The patient agreed with the plan and demonstrated an understanding of the instructions. The  patient was advised to call back or seek an in-person evaluation if the symptoms worsen or if the condition fails to improve as anticipated.   I provided 12 minutes of non-face-to-face time during this encounter.  This includes time for charting and coordination of care   Harriette Ohara, MD

## 2022-09-08 ENCOUNTER — Inpatient Hospital Stay (HOSPITAL_BASED_OUTPATIENT_CLINIC_OR_DEPARTMENT_OTHER): Payer: BC Managed Care – PPO | Admitting: Hematology and Oncology

## 2022-09-08 DIAGNOSIS — D539 Nutritional anemia, unspecified: Secondary | ICD-10-CM

## 2022-09-08 NOTE — Assessment & Plan Note (Deleted)
Lab review: 03/23/2022: Hemoglobin 11, MCV 84, RDW 14.7, platelets 244, WBC 9.5, creatinine 1.07, normal LFTs, TIBC 430, iron saturation 19%, ferritin 11 04/27/2022: Hemoglobin 10.5, MCV 85 08/31/2022: Hemoglobin 11.6, MCV 89.1, SPEP: No M protein, reticulocyte's: Absolute: 71.2, 1.8%, reticulocyte hemoglobin: 31.9, LDH 191, folate 11.7, B12 306, iron saturation 14%, ferritin 14, erythropoietin 19  Based on the above lab results, patient has only mild anemia without any clear-cut identifiable lab abnormality.  It does appear that the bone marrow is functioning adequately.  Recommendation: Observation. If the hemoglobin drops below 10 g, please refer the patient back to [place].

## 2022-09-10 ENCOUNTER — Telehealth: Payer: Self-pay | Admitting: Hematology and Oncology

## 2022-09-10 NOTE — Telephone Encounter (Signed)
Scheduled appointment per 10/17 los. Patient is aware.

## 2022-12-07 ENCOUNTER — Inpatient Hospital Stay: Payer: BC Managed Care – PPO | Attending: Hematology and Oncology

## 2022-12-07 ENCOUNTER — Other Ambulatory Visit: Payer: Self-pay

## 2022-12-07 DIAGNOSIS — D509 Iron deficiency anemia, unspecified: Secondary | ICD-10-CM | POA: Diagnosis present

## 2022-12-07 DIAGNOSIS — D539 Nutritional anemia, unspecified: Secondary | ICD-10-CM

## 2022-12-07 LAB — CBC WITH DIFFERENTIAL (CANCER CENTER ONLY)
Abs Immature Granulocytes: 0.02 10*3/uL (ref 0.00–0.07)
Basophils Absolute: 0.1 10*3/uL (ref 0.0–0.1)
Basophils Relative: 1 %
Eosinophils Absolute: 0.1 10*3/uL (ref 0.0–0.5)
Eosinophils Relative: 2 %
HCT: 38 % (ref 36.0–46.0)
Hemoglobin: 12.1 g/dL (ref 12.0–15.0)
Immature Granulocytes: 0 %
Lymphocytes Relative: 38 %
Lymphs Abs: 3 10*3/uL (ref 0.7–4.0)
MCH: 27.9 pg (ref 26.0–34.0)
MCHC: 31.8 g/dL (ref 30.0–36.0)
MCV: 87.6 fL (ref 80.0–100.0)
Monocytes Absolute: 0.4 10*3/uL (ref 0.1–1.0)
Monocytes Relative: 6 %
Neutro Abs: 4.2 10*3/uL (ref 1.7–7.7)
Neutrophils Relative %: 53 %
Platelet Count: 247 10*3/uL (ref 150–400)
RBC: 4.34 MIL/uL (ref 3.87–5.11)
RDW: 13.3 % (ref 11.5–15.5)
WBC Count: 7.8 10*3/uL (ref 4.0–10.5)
nRBC: 0 % (ref 0.0–0.2)

## 2022-12-07 LAB — IRON AND IRON BINDING CAPACITY (CC-WL,HP ONLY)
Iron: 58 ug/dL (ref 28–170)
Saturation Ratios: 15 % (ref 10.4–31.8)
TIBC: 388 ug/dL (ref 250–450)
UIBC: 330 ug/dL (ref 148–442)

## 2022-12-07 LAB — FERRITIN: Ferritin: 12 ng/mL (ref 11–307)

## 2022-12-08 NOTE — Assessment & Plan Note (Signed)
Lab review: 03/23/2022: Hemoglobin 11, MCV 84, RDW 14.7, platelets 244, WBC 9.5, creatinine 1.07, normal LFTs, TIBC 430, iron saturation 19%, ferritin 11 04/27/2022: Hemoglobin 10.5, MCV 85 08/31/2022: Hemoglobin 11.6, MCV 89.1, SPEP: No M protein, reticulocyte's: Absolute: 71.2, 1.8%, reticulocyte hemoglobin: 31.9, LDH 191, folate 11.7, B12 306, iron saturation 14%, ferritin 14, erythropoietin 19 12/07/2021: Hemoglobin 12.1, MCV 87.6, iron saturation 15%, ferritin 12  Anemia has improved without any perceivable difference in the iron parameters.  Continue to watch and monitor with recheck of the labs and 6 months

## 2022-12-09 ENCOUNTER — Inpatient Hospital Stay (HOSPITAL_BASED_OUTPATIENT_CLINIC_OR_DEPARTMENT_OTHER): Payer: BC Managed Care – PPO | Admitting: Hematology and Oncology

## 2022-12-09 DIAGNOSIS — D509 Iron deficiency anemia, unspecified: Secondary | ICD-10-CM

## 2022-12-09 NOTE — Progress Notes (Signed)
HEMATOLOGY-ONCOLOGY TELEPHONE VISIT PROGRESS NOTE  I connected with our patient on 12/09/22 at  3:00 PM EST by telephone and verified that I am speaking with the correct person using two identifiers.  I discussed the limitations, risks, security and privacy concerns of performing an evaluation and management service by telephone and the availability of in person appointments.  I also discussed with the patient that there may be a patient responsible charge related to this service. The patient expressed understanding and agreed to proceed.   History of Present Illness: Follow-up to discuss results of blood work done for anemia  REVIEW OF SYSTEMS:   Constitutional: Denies fevers, chills or abnormal weight loss All other systems were reviewed with the patient and are negative. Observations/Objective:     Assessment Plan:  Iron deficiency anemia Lab review: 03/23/2022: Hemoglobin 11, MCV 84, RDW 14.7, platelets 244, WBC 9.5, creatinine 1.07, normal LFTs, TIBC 430, iron saturation 19%, ferritin 11 04/27/2022: Hemoglobin 10.5, MCV 85 08/31/2022: Hemoglobin 11.6, MCV 89.1, SPEP: No M protein, reticulocyte's: Absolute: 71.2, 1.8%, reticulocyte hemoglobin: 31.9, LDH 191, folate 11.7, B12 306, iron saturation 14%, ferritin 14, erythropoietin 19 12/07/2021: Hemoglobin 12.1, MCV 87.6, iron saturation 15%, ferritin 12  Anemia has improved without any perceivable difference in the iron parameters. Patient has a lab appointment with Dr. Delfina Redwood in May.  We will request Dr. Delfina Redwood to fax the labs to [place]. Telephone visit in June to discuss labs   I discussed the assessment and treatment plan with the patient. The patient was provided an opportunity to ask questions and all were answered. The patient agreed with the plan and demonstrated an understanding of the instructions. The patient was advised to call back or seek an in-person evaluation if the symptoms worsen or if the condition fails to improve as  anticipated.   I provided 12 minutes of non-face-to-face time during this encounter.  This includes time for charting and coordination of care   Harriette Ohara, MD  I Gardiner Coins am acting as a scribe for Dr.John Vasconcelos  I have reviewed the above documentation for accuracy and completeness, and I agree with the above.

## 2022-12-11 ENCOUNTER — Telehealth: Payer: Self-pay | Admitting: Hematology and Oncology

## 2022-12-11 NOTE — Telephone Encounter (Signed)
Scheduled appointment per 1/17 los. Patient is aware

## 2023-04-16 ENCOUNTER — Other Ambulatory Visit: Payer: Self-pay | Admitting: Internal Medicine

## 2023-04-16 DIAGNOSIS — Z1231 Encounter for screening mammogram for malignant neoplasm of breast: Secondary | ICD-10-CM

## 2023-04-26 ENCOUNTER — Inpatient Hospital Stay: Payer: BC Managed Care – PPO | Admitting: Hematology and Oncology

## 2023-04-26 ENCOUNTER — Other Ambulatory Visit: Payer: Self-pay | Admitting: *Deleted

## 2023-04-26 ENCOUNTER — Telehealth: Payer: Self-pay | Admitting: *Deleted

## 2023-04-26 DIAGNOSIS — D509 Iron deficiency anemia, unspecified: Secondary | ICD-10-CM

## 2023-04-26 NOTE — Assessment & Plan Note (Deleted)
Lab review: 03/23/2022: Hemoglobin 11, MCV 84, RDW 14.7, platelets 244, WBC 9.5, creatinine 1.07, normal LFTs, TIBC 430, iron saturation 19%, ferritin 11 04/27/2022: Hemoglobin 10.5, MCV 85 08/31/2022: Hemoglobin 11.6, MCV 89.1, SPEP: No M protein, reticulocyte's: Absolute: 71.2, 1.8%, reticulocyte hemoglobin: 31.9, LDH 191, folate 11.7, B12 306, iron saturation 14%, ferritin 14, erythropoietin 19 12/07/2022: Hemoglobin 12.1, MCV 87.6, iron saturation 15%, ferritin 12   Anemia has improved without any perceivable difference in the iron parameters. Patient has a lab appointment with Dr. Nehemiah Settle in May.  We will request Dr. Nehemiah Settle to fax the labs to [place]. Telephone visit in June to discuss labs

## 2023-04-26 NOTE — Telephone Encounter (Signed)
RN placed call to Dr. Idelle Crouch office with [name] PCP to obtain recent lab work.  Office states pt only had CBC and CMP drawn 04/05/2023.  Per MD pt appt for today needs to be reschedule and pt needing to come in for iron labs prior to MD visit.  RN attempt x1 to contact pt.  No answer, LVM.  RN also sent message to scheduling team to reschedule appts.

## 2023-04-27 ENCOUNTER — Encounter: Payer: Self-pay | Admitting: Hematology and Oncology

## 2023-04-29 ENCOUNTER — Encounter: Payer: Self-pay | Admitting: Hematology and Oncology

## 2023-04-30 ENCOUNTER — Telehealth: Payer: Self-pay | Admitting: Hematology and Oncology

## 2023-05-16 NOTE — Progress Notes (Signed)
HEMATOLOGY-ONCOLOGY TELEPHONE VISIT PROGRESS NOTE  I connected with our patient on 05/20/23 at 10:15 AM EDT by telephone and verified that I am speaking with the correct person using two identifiers.  I discussed the limitations, risks, security and privacy concerns of performing an evaluation and management service by telephone and the availability of in person appointments.  I also discussed with the patient that there may be a patient responsible charge related to this service. The patient expressed understanding and agreed to proceed.   History of Present Illness: [name] 57 y.o. female with a diagnosis of anemia. She presents to the clinic for a telephone follow-up to discuss labs.  She had labs done in May which showed that she is starting to get anemic.  REVIEW OF SYSTEMS:   Constitutional: Denies fevers, chills or abnormal weight loss All other systems were reviewed with the patient and are negative. Observations/Objective:    Assessment Plan:  Iron deficiency anemia Lab review: 03/23/2022: Hemoglobin 11, MCV 84, RDW 14.7, platelets 244, WBC 9.5, creatinine 1.07, normal LFTs, TIBC 430, iron saturation 19%, ferritin 11 04/27/2022: Hemoglobin 10.5, MCV 85 08/31/2022: Hemoglobin 11.6, MCV 89.1, SPEP: No M protein, reticulocyte's: Absolute: 71.2, 1.8%, reticulocyte hemoglobin: 31.9, LDH 191, folate 11.7, B12 306, iron saturation 14%, ferritin 14, erythropoietin 19 12/07/2022: Hemoglobin 12.1, MCV 87.6, iron saturation 15%, ferritin 12 04/04/2013: Hemoglobin 10.9, MCV 85.1, platelets 239   Patient is slightly more anemic than January 2024.  Therefore we would like to obtain another set of iron studies for further evaluation She will come tomorrow to get this labs drawn.  I will call her next week to discuss the results.    I discussed the assessment and treatment plan with the patient. The patient was provided an opportunity to ask questions and all were answered. The patient agreed with  the plan and demonstrated an understanding of the instructions. The patient was advised to call back or seek an in-person evaluation if the symptoms worsen or if the condition fails to improve as anticipated.   I provided 12 minutes of non-face-to-face time during this encounter.  This includes time for charting and coordination of care   Tamsen Meek, MD  I Janan Ridge am acting as a scribe for Dr.Vinay Gudena  I have reviewed the above documentation for accuracy and completeness, and I agree with the above.

## 2023-05-18 ENCOUNTER — Inpatient Hospital Stay: Payer: BC Managed Care – PPO | Attending: Hematology and Oncology

## 2023-05-18 DIAGNOSIS — D509 Iron deficiency anemia, unspecified: Secondary | ICD-10-CM | POA: Insufficient documentation

## 2023-05-20 ENCOUNTER — Inpatient Hospital Stay (HOSPITAL_BASED_OUTPATIENT_CLINIC_OR_DEPARTMENT_OTHER): Payer: BC Managed Care – PPO | Admitting: Hematology and Oncology

## 2023-05-20 DIAGNOSIS — D509 Iron deficiency anemia, unspecified: Secondary | ICD-10-CM | POA: Diagnosis not present

## 2023-05-20 NOTE — Assessment & Plan Note (Signed)
Lab review: 03/23/2022: Hemoglobin 11, MCV 84, RDW 14.7, platelets 244, WBC 9.5, creatinine 1.07, normal LFTs, TIBC 430, iron saturation 19%, ferritin 11 04/27/2022: Hemoglobin 10.5, MCV 85 08/31/2022: Hemoglobin 11.6, MCV 89.1, SPEP: No M protein, reticulocyte's: Absolute: 71.2, 1.8%, reticulocyte hemoglobin: 31.9, LDH 191, folate 11.7, B12 306, iron saturation 14%, ferritin 14, erythropoietin 19 12/07/2022: Hemoglobin 12.1, MCV 87.6, iron saturation 15%, ferritin 12 04/04/2013: Hemoglobin 10.9, MCV 85.1, platelets 239   Patient is slightly more anemic than January 2024.  Therefore we would like to obtain another set of iron studies for further evaluation  Telephone visit after labs to discuss results

## 2023-05-21 ENCOUNTER — Inpatient Hospital Stay: Payer: BC Managed Care – PPO

## 2023-05-21 ENCOUNTER — Other Ambulatory Visit: Payer: Self-pay

## 2023-05-21 DIAGNOSIS — D509 Iron deficiency anemia, unspecified: Secondary | ICD-10-CM | POA: Diagnosis present

## 2023-05-21 LAB — IRON AND IRON BINDING CAPACITY (CC-WL,HP ONLY)
Iron: 50 ug/dL (ref 28–170)
Saturation Ratios: 12 % (ref 10.4–31.8)
TIBC: 419 ug/dL (ref 250–450)
UIBC: 369 ug/dL (ref 148–442)

## 2023-05-21 LAB — FERRITIN: Ferritin: 9 ng/mL — ABNORMAL LOW (ref 11–307)

## 2023-05-21 LAB — CBC WITH DIFFERENTIAL (CANCER CENTER ONLY)
Abs Immature Granulocytes: 0.02 10*3/uL (ref 0.00–0.07)
Basophils Absolute: 0 10*3/uL (ref 0.0–0.1)
Basophils Relative: 1 %
Eosinophils Absolute: 0.1 10*3/uL (ref 0.0–0.5)
Eosinophils Relative: 1 %
HCT: 36 % (ref 36.0–46.0)
Hemoglobin: 11.3 g/dL — ABNORMAL LOW (ref 12.0–15.0)
Immature Granulocytes: 0 %
Lymphocytes Relative: 44 %
Lymphs Abs: 3.1 10*3/uL (ref 0.7–4.0)
MCH: 27.2 pg (ref 26.0–34.0)
MCHC: 31.4 g/dL (ref 30.0–36.0)
MCV: 86.7 fL (ref 80.0–100.0)
Monocytes Absolute: 0.5 10*3/uL (ref 0.1–1.0)
Monocytes Relative: 7 %
Neutro Abs: 3.2 10*3/uL (ref 1.7–7.7)
Neutrophils Relative %: 47 %
Platelet Count: 258 10*3/uL (ref 150–400)
RBC: 4.15 MIL/uL (ref 3.87–5.11)
RDW: 13.6 % (ref 11.5–15.5)
WBC Count: 7 10*3/uL (ref 4.0–10.5)
nRBC: 0 % (ref 0.0–0.2)

## 2023-05-24 ENCOUNTER — Telehealth: Payer: Self-pay | Admitting: Hematology and Oncology

## 2023-05-24 ENCOUNTER — Inpatient Hospital Stay: Payer: BC Managed Care – PPO | Admitting: Adult Health

## 2023-05-24 NOTE — Telephone Encounter (Signed)
Scheduled appointment per los. Left voicemail. 

## 2023-05-26 ENCOUNTER — Other Ambulatory Visit: Payer: Self-pay | Admitting: Hematology and Oncology

## 2023-05-26 ENCOUNTER — Telehealth: Payer: Self-pay | Admitting: Hematology and Oncology

## 2023-05-26 DIAGNOSIS — D509 Iron deficiency anemia, unspecified: Secondary | ICD-10-CM

## 2023-05-26 NOTE — Progress Notes (Signed)
:  Lab review Ferritin 9, hemoglobin 11.3 Patient complains of profound fatigue. Previously she had hysterectomy and also had a normal colonoscopy and endoscopy. Therefore malabsorption is possible etiology. I recommended that she receive 3 doses of IV iron.

## 2023-05-26 NOTE — Telephone Encounter (Signed)
IV iron recommended.

## 2023-05-29 ENCOUNTER — Encounter: Payer: Self-pay | Admitting: Hematology and Oncology

## 2023-06-04 ENCOUNTER — Inpatient Hospital Stay: Payer: BC Managed Care – PPO | Attending: Adult Health | Admitting: Adult Health

## 2023-06-04 ENCOUNTER — Encounter: Payer: Self-pay | Admitting: Adult Health

## 2023-06-04 DIAGNOSIS — D509 Iron deficiency anemia, unspecified: Secondary | ICD-10-CM | POA: Diagnosis not present

## 2023-06-04 NOTE — Assessment & Plan Note (Signed)
I spoke with [name] today in detail about her iron deficiency anemia.  We reviewed her previous labs and I answered several questions she had.  Next steps:  I recommended that she proceed with IV iron with Venofer 300 mg weekly x 3.  I reached out to our schedulers to get this scheduled I asked my nurse to obtain the upper endoscopy and colonoscopy records from Dr. 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  Will follow-up with her once we obtain the results from Dr. Kenna Gilbert office.  She should return in 12 weeks for labs and follow-up.

## 2023-06-04 NOTE — Progress Notes (Signed)
[facility] Cancer Follow up:    Renford Dills, MD [address] [place] [address]  I connected with [name] on 06/04/23 at  8:30 AM EDT by telephone and verified that I am speaking with the correct person using two identifiers.  I discussed the limitations, risks, security and privacy concerns of performing an evaluation and management service by telephone and the availability of in person appointments.  I also discussed with the patient that there may be a patient responsible charge related to this service. The patient expressed understanding and agreed to proceed.   Patient location: home Provider location: [facility] office  DIAGNOSIS: Iron deficiency anemia  SUMMARY OF HEMATOLOGIC HISTORY: 03/23/2022: Hemoglobin 11, MCV 84, RDW 14.7, platelets 244, WBC 9.5, creatinine 1.07, normal LFTs, TIBC 430, iron saturation 19%, ferritin 11 04/27/2022: Hemoglobin 10.5, MCV 85 08/31/2022: Hemoglobin 11.6, MCV 89.1, SPEP: No M protein, reticulocyte's: Absolute: 71.2, 1.8%, reticulocyte hemoglobin: 31.9, LDH 191, folate 11.7, B12 306, iron saturation 14%, ferritin 14, erythropoietin 19 12/07/2022: Hemoglobin 12.1, MCV 87.6, iron saturation 15%, ferritin 12 04/05/2023: Hemoglobin 10.9, MCV 85.1, platelets 239  CURRENT THERAPY: intermittent IV iron  INTERVAL HISTORY: [name] 57 y.o. female returns for follow up of her iron deficiency.  She tells me she is feeling moderately well.  She hasn't had heavy cycles since her hysterectomy in 2016, and her most recent upper endoscopy and colonoscopy occurred in 2023 with Dr. Loreta Ave.  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     Latest Reference Range & Units 05/21/23 13:29  Ferritin 11 - 307 ng/mL 9 (L)  (L): Data is abnormally low Patient Active Problem List   Diagnosis Date Noted   Hyperthyroidism 06/22/2019   Migraine without aura and without status  migrainosus, not intractable 01/02/2019   S/P hysterectomy 11/07/2015   Constipation 01/09/2015   Other malaise and fatigue 08/05/2014   Iron deficiency anemia 08/05/2014   Allergic state 08/05/2014   Preventative health care 08/05/2014   Annual physical exam 12/29/2011   HEMORRHOIDS 12/30/2010   DERMATITIS DUE TO FOOD TAKEN INTERNALLY 12/05/2010   HYPERCHOLESTEROLEMIA 11/21/2008   Migraine 06/01/2008    is allergic to methimazole.  MEDICAL HISTORY: Past Medical History:  Diagnosis Date   Allergic state 08/05/2014   Allergy    Anal bleeding    Anemia    Blood in stool    Constipation 01/09/2015   Dermatitis due to food taken internally    rast to milk--class 2   Easy bruising    GERD (gastroesophageal reflux disease)    Hemorrhoids    Hypercholesteremia    Migraine headache    Nasal congestion     SURGICAL HISTORY: Past Surgical History:  Procedure Laterality Date   EYE SURGERY Bilateral    lasik   HEMORRHOID SURGERY  06/19/2012   Dr. Claud Kelp   MYOMECTOMY  04/2009   Dr. Seymour Bars   ROBOTIC ASSISTED TOTAL HYSTERECTOMY WITH SALPINGECTOMY Bilateral 11/07/2015   Procedure: ROBOTIC ASSISTED TOTAL HYSTERECTOMY WITH BILATERAL SALPINGECTOMY;  Surgeon: Maxie Better, MD;  Location: [facility];  Service: Gynecology;  Laterality: Bilateral;    SOCIAL HISTORY: Social History   Socioeconomic History   Marital status: Married    Spouse name: spencer   Number of children: 0   Years of education: Not on file   Highest education level: Not on file  Occupational History   Occupation: Runner, broadcasting/film/video at  [employer], in [place]  Tobacco Use   Smoking status: Never   Smokeless tobacco: Never  Substance and Sexual Activity   Alcohol use: Yes    Comment: socially - sometimes   Drug use: No   Sexual activity: Yes    Birth control/protection: I.U.D.    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  Other Topics Concern   Not on file  Social  History Narrative   Not on file   Social Determinants of Health   Financial Resource Strain: Low Risk  (06/13/2022)   Received from [facility]   Overall Financial Resource Strain (CARDIA)    Difficulty of Paying Living Expenses: Not very hard  Food Insecurity: No Food Insecurity (06/13/2022)   Received from [facility]   Hunger Vital Sign    Worried About Running Out of Food in the Last Year: Never true    Ran Out of Food in the Last Year: Never true  Transportation Needs: No Transportation Needs (12/12/2021)   Received from [facility] - Transportation    Lack of Transportation (Medical): No    Lack of Transportation (Non-Medical): No  Physical Activity: Inactive (06/13/2022)   Received from [facility]   Exercise Vital Sign    Days of Exercise per Week: 0 days    Minutes of Exercise per Session: 0 min  Stress: Stress Concern Present (06/13/2022)   Received from [facility] - Occupational Stress Questionnaire    Feeling of Stress : To some extent  Social Connections: Socially Integrated (06/13/2022)   Received from [facility]   Social Network    How would you rate your social network (family, work, friends)?: Good participation with social networks  Intimate Partner Violence: Not At Risk (06/13/2022)   Received from [facility]   HITS    Over the last 12 months how often did your partner physically hurt you?: 1    Over the last 12 months how often did your partner insult you or talk down to you?: 2    Over the last 12 months how often did your partner threaten you with physical harm?: 1    Over the last 12 months how often did your partner scream or curse at you?: 1    FAMILY HISTORY: Family History  Problem Relation Age of Onset   Stroke Father    Hypertension Father    Cancer Mother 52       breast/bone cancer   Heart disease Mother        CHF   Hypertension Mother    Hyperlipidemia Mother    Heart  attack Mother    Stroke Mother    Diabetes Mother        type 2   COPD Mother    Kidney disease Mother        stage 4   Migraines Sister    Allergies Sister        seasonal   Congestive Heart Failure Maternal Grandmother    Diabetes Maternal Grandmother        type 2   Hypertension Maternal Grandmother     Review of Systems  Constitutional:  Positive for fatigue. Negative for appetite change, chills, fever and unexpected weight change.  HENT:   Negative for hearing loss, lump/mass and trouble swallowing.   Eyes:  Negative for eye problems and icterus.  Respiratory:  Negative for chest tightness, cough and  shortness of breath.   Cardiovascular:  Negative for chest pain, leg swelling and palpitations.  Gastrointestinal:  Negative for abdominal distention, abdominal pain, constipation, diarrhea, nausea and vomiting.  Endocrine: Negative for hot flashes.  Genitourinary:  Negative for difficulty urinating.   Musculoskeletal:  Negative for arthralgias.  Skin:  Negative for itching and rash.  Neurological:  Negative for dizziness, extremity weakness, headaches and numbness.  Hematological:  Negative for adenopathy. Does not bruise/bleed easily.  Psychiatric/Behavioral:  Negative for depression. The patient is not nervous/anxious.       PHYSICAL EXAMINATION Patient sounds well, in no apparent distress  LABORATORY DATA:  CBC    Component Value Date/Time   WBC 7.0 05/21/2023 1329   WBC 6.0 04/07/2016 0707   RBC 4.15 05/21/2023 1329   HGB 11.3 (L) 05/21/2023 1329   HCT 36.0 05/21/2023 1329   PLT 258 05/21/2023 1329   MCV 86.7 05/21/2023 1329   MCH 27.2 05/21/2023 1329   MCHC 31.4 05/21/2023 1329   RDW 13.6 05/21/2023 1329   LYMPHSABS 3.1 05/21/2023 1329   MONOABS 0.5 05/21/2023 1329   EOSABS 0.1 05/21/2023 1329   BASOSABS 0.0 05/21/2023 1329          ASSESSMENT and THERAPY PLAN:   Iron deficiency anemia I spoke with Larita Fife today in detail about her iron deficiency  anemia.  We reviewed her previous labs and I answered several questions she had.  Next steps:  I recommended that she proceed with IV iron with Venofer 300 mg weekly x 3.  I reached out to our schedulers to get this scheduled I asked my nurse to obtain the upper endoscopy and colonoscopy records from Dr. 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  Will follow-up with her once we obtain the results from Dr. Kenna Gilbert office.  She should return in 12 weeks for labs and follow-up.    Follow up instructions:    -Return to cancer center as noted above The patient was provided an opportunity to ask questions and all were answered. The patient agreed with the plan and demonstrated an understanding of the instructions.   The patient was advised to call back or seek an in-person evaluation if the symptoms worsen or if the condition fails to improve as anticipated.   I provided 25 minutes of non face-to-face telephone visit time during this encounter, and > 50% was spent counseling as documented under my assessment & plan.  Lillard Anes, NP 06/04/23 9:08 AM Medical Oncology and Hematology [facility] [address] [place], [place] [address] Tel. [phone]    Fax. [phone]

## 2023-06-11 ENCOUNTER — Telehealth: Payer: Self-pay | Admitting: Hematology and Oncology

## 2023-06-11 NOTE — Telephone Encounter (Signed)
Scheduled appointments per staff message. Patient is aware of the made appointments.

## 2023-06-15 ENCOUNTER — Telehealth: Payer: Self-pay | Admitting: Adult Health

## 2023-06-18 ENCOUNTER — Other Ambulatory Visit: Payer: Self-pay

## 2023-06-18 ENCOUNTER — Inpatient Hospital Stay: Payer: BC Managed Care – PPO

## 2023-06-18 VITALS — BP 138/73 | HR 65 | Resp 16

## 2023-06-18 DIAGNOSIS — D509 Iron deficiency anemia, unspecified: Secondary | ICD-10-CM

## 2023-06-18 MED ORDER — SODIUM CHLORIDE 0.9 % IV SOLN: Freq: Once | INTRAVENOUS | Status: AC

## 2023-06-18 MED ORDER — SODIUM CHLORIDE 0.9 % IV SOLN
300.0000 mg | Freq: Once | INTRAVENOUS | Status: AC
  Administered 2023-06-18: 300 mg via INTRAVENOUS
  Filled 2023-06-18: qty 300

## 2023-06-18 NOTE — Patient Instructions (Signed)
Iron Sucrose Injection What is this medication? IRON SUCROSE (EYE ern SOO krose) treats low levels of iron (iron deficiency anemia) in people with kidney disease. Iron is a mineral that plays an important role in making red blood cells, which carry oxygen from your lungs to the rest of your body. This medicine may be used for other purposes; ask your health care provider or pharmacist if you have questions. COMMON BRAND NAME(S): Venofer What should I tell my care team before I take this medication? They need to know if you have any of these conditions: Anemia not caused by low iron levels Heart disease High levels of iron in the blood Kidney disease Liver disease An unusual or allergic reaction to iron, other medications, foods, dyes, or preservatives Pregnant or trying to get pregnant Breastfeeding How should I use this medication? This medication is for infusion into a vein. It is given in a hospital or clinic setting. Talk to your care team about the use of this medication in children. While this medication may be prescribed for children as young as 2 years for selected conditions, precautions do apply. Overdosage: If you think you have taken too much of this medicine contact a poison control center or emergency room at once. NOTE: This medicine is only for you. Do not share this medicine with others. What if I miss a dose? Keep appointments for follow-up doses. It is important not to miss your dose. Call your care team if you are unable to keep an appointment. What may interact with this medication? Do not take this medication with any of the following: Deferoxamine Dimercaprol Other iron products This medication may also interact with the following: Chloramphenicol Deferasirox This list may not describe all possible interactions. Give your health care provider a list of all the medicines, herbs, non-prescription drugs, or dietary supplements you use. Also tell them if you smoke,  drink alcohol, or use illegal drugs. Some items may interact with your medicine. What should I watch for while using this medication? Visit your care team regularly. Tell your care team if your symptoms do not start to get better or if they get worse. You may need blood work done while you are taking this medication. You may need to follow a special diet. Talk to your care team. Foods that contain iron include: whole grains/cereals, dried fruits, beans, or peas, leafy green vegetables, and organ meats (liver, kidney). What side effects may I notice from receiving this medication? Side effects that you should report to your care team as soon as possible: Allergic reactions--skin rash, itching, hives, swelling of the face, lips, tongue, or throat Low blood pressure--dizziness, feeling faint or lightheaded, blurry vision Shortness of breath Side effects that usually do not require medical attention (report to your care team if they continue or are bothersome): Flushing Headache Joint pain Muscle pain Nausea Pain, redness, or irritation at injection site This list may not describe all possible side effects. Call your doctor for medical advice about side effects. You may report side effects to FDA at 1-800-FDA-1088. Where should I keep my medication? This medication is given in a hospital or clinic. It will not be stored at home. NOTE: This sheet is a summary. It may not cover all possible information. If you have questions about this medicine, talk to your doctor, pharmacist, or health care provider.  2024 Elsevier/Gold Standard (2023-04-16 00:00:00)

## 2023-06-25 ENCOUNTER — Inpatient Hospital Stay: Payer: BC Managed Care – PPO | Attending: Adult Health

## 2023-06-25 ENCOUNTER — Other Ambulatory Visit: Payer: Self-pay

## 2023-06-25 VITALS — BP 139/67 | HR 72 | Temp 98.3°F | Resp 16

## 2023-06-25 DIAGNOSIS — D509 Iron deficiency anemia, unspecified: Secondary | ICD-10-CM | POA: Insufficient documentation

## 2023-06-25 MED ORDER — SODIUM CHLORIDE 0.9 % IV SOLN
300.0000 mg | Freq: Once | INTRAVENOUS | Status: AC
  Administered 2023-06-25: 300 mg via INTRAVENOUS
  Filled 2023-06-25: qty 300

## 2023-06-25 MED ORDER — SODIUM CHLORIDE 0.9 % IV SOLN: Freq: Once | INTRAVENOUS | Status: AC

## 2023-06-25 NOTE — Patient Instructions (Signed)
Iron Sucrose Injection What is this medication? IRON SUCROSE (EYE ern SOO krose) treats low levels of iron (iron deficiency anemia) in people with kidney disease. Iron is a mineral that plays an important role in making red blood cells, which carry oxygen from your lungs to the rest of your body. This medicine may be used for other purposes; ask your health care provider or pharmacist if you have questions. COMMON BRAND NAME(S): Venofer What should I tell my care team before I take this medication? They need to know if you have any of these conditions: Anemia not caused by low iron levels Heart disease High levels of iron in the blood Kidney disease Liver disease An unusual or allergic reaction to iron, other medications, foods, dyes, or preservatives Pregnant or trying to get pregnant Breastfeeding How should I use this medication? This medication is for infusion into a vein. It is given in a hospital or clinic setting. Talk to your care team about the use of this medication in children. While this medication may be prescribed for children as young as 2 years for selected conditions, precautions do apply. Overdosage: If you think you have taken too much of this medicine contact a poison control center or emergency room at once. NOTE: This medicine is only for you. Do not share this medicine with others. What if I miss a dose? Keep appointments for follow-up doses. It is important not to miss your dose. Call your care team if you are unable to keep an appointment. What may interact with this medication? Do not take this medication with any of the following: Deferoxamine Dimercaprol Other iron products This medication may also interact with the following: Chloramphenicol Deferasirox This list may not describe all possible interactions. Give your health care provider a list of all the medicines, herbs, non-prescription drugs, or dietary supplements you use. Also tell them if you smoke,  drink alcohol, or use illegal drugs. Some items may interact with your medicine. What should I watch for while using this medication? Visit your care team regularly. Tell your care team if your symptoms do not start to get better or if they get worse. You may need blood work done while you are taking this medication. You may need to follow a special diet. Talk to your care team. Foods that contain iron include: whole grains/cereals, dried fruits, beans, or peas, leafy green vegetables, and organ meats (liver, kidney). What side effects may I notice from receiving this medication? Side effects that you should report to your care team as soon as possible: Allergic reactions--skin rash, itching, hives, swelling of the face, lips, tongue, or throat Low blood pressure--dizziness, feeling faint or lightheaded, blurry vision Shortness of breath Side effects that usually do not require medical attention (report to your care team if they continue or are bothersome): Flushing Headache Joint pain Muscle pain Nausea Pain, redness, or irritation at injection site This list may not describe all possible side effects. Call your doctor for medical advice about side effects. You may report side effects to FDA at 1-800-FDA-1088. Where should I keep my medication? This medication is given in a hospital or clinic. It will not be stored at home. NOTE: This sheet is a summary. It may not cover all possible information. If you have questions about this medicine, talk to your doctor, pharmacist, or health care provider.  2024 Elsevier/Gold Standard (2023-04-16 00:00:00)

## 2023-06-25 NOTE — Progress Notes (Signed)
Pt observed for 30 min post venofer infusion. Tolerated well with no adverse s/s. No hx adverse s/s. VS WNL. Pt ambulated independently to lobby for discharge.

## 2023-07-02 ENCOUNTER — Inpatient Hospital Stay: Payer: BC Managed Care – PPO

## 2023-07-02 ENCOUNTER — Other Ambulatory Visit: Payer: Self-pay

## 2023-07-02 VITALS — BP 130/87 | HR 74 | Temp 98.4°F | Resp 16

## 2023-07-02 DIAGNOSIS — D509 Iron deficiency anemia, unspecified: Secondary | ICD-10-CM

## 2023-07-02 MED ORDER — SODIUM CHLORIDE 0.9 % IV SOLN
300.0000 mg | Freq: Once | INTRAVENOUS | Status: AC
  Administered 2023-07-02: 300 mg via INTRAVENOUS
  Filled 2023-07-02: qty 300

## 2023-07-02 MED ORDER — SODIUM CHLORIDE 0.9 % IV SOLN: Freq: Once | INTRAVENOUS | Status: AC

## 2023-07-02 NOTE — Progress Notes (Signed)
Patient waited 30 minute post iron infusion wait with no issues.

## 2023-07-02 NOTE — Patient Instructions (Signed)
 Iron Sucrose Injection What is this medication? IRON SUCROSE (EYE ern SOO krose) treats low levels of iron (iron deficiency anemia) in people with kidney disease. Iron is a mineral that plays an important role in making red blood cells, which carry oxygen from your lungs to the rest of your body. This medicine may be used for other purposes; ask your health care provider or pharmacist if you have questions. COMMON BRAND NAME(S): Venofer What should I tell my care team before I take this medication? They need to know if you have any of these conditions: Anemia not caused by low iron levels Heart disease High levels of iron in the blood Kidney disease Liver disease An unusual or allergic reaction to iron, other medications, foods, dyes, or preservatives Pregnant or trying to get pregnant Breastfeeding How should I use this medication? This medication is for infusion into a vein. It is given in a hospital or clinic setting. Talk to your care team about the use of this medication in children. While this medication may be prescribed for children as young as 2 years for selected conditions, precautions do apply. Overdosage: If you think you have taken too much of this medicine contact a poison control center or emergency room at once. NOTE: This medicine is only for you. Do not share this medicine with others. What if I miss a dose? Keep appointments for follow-up doses. It is important not to miss your dose. Call your care team if you are unable to keep an appointment. What may interact with this medication? Do not take this medication with any of the following: Deferoxamine Dimercaprol Other iron products This medication may also interact with the following: Chloramphenicol Deferasirox This list may not describe all possible interactions. Give your health care provider a list of all the medicines, herbs, non-prescription drugs, or dietary supplements you use. Also tell them if you smoke,  drink alcohol, or use illegal drugs. Some items may interact with your medicine. What should I watch for while using this medication? Visit your care team regularly. Tell your care team if your symptoms do not start to get better or if they get worse. You may need blood work done while you are taking this medication. You may need to follow a special diet. Talk to your care team. Foods that contain iron include: whole grains/cereals, dried fruits, beans, or peas, leafy green vegetables, and organ meats (liver, kidney). What side effects may I notice from receiving this medication? Side effects that you should report to your care team as soon as possible: Allergic reactions--skin rash, itching, hives, swelling of the face, lips, tongue, or throat Low blood pressure--dizziness, feeling faint or lightheaded, blurry vision Shortness of breath Side effects that usually do not require medical attention (report to your care team if they continue or are bothersome): Flushing Headache Joint pain Muscle pain Nausea Pain, redness, or irritation at injection site This list may not describe all possible side effects. Call your doctor for medical advice about side effects. You may report side effects to FDA at 1-800-FDA-1088. Where should I keep my medication? This medication is given in a hospital or clinic. It will not be stored at home. NOTE: This sheet is a summary. It may not cover all possible information. If you have questions about this medicine, talk to your doctor, pharmacist, or health care provider.  2024 Elsevier/Gold Standard (2023-04-16 00:00:00)

## 2023-07-09 ENCOUNTER — Ambulatory Visit
Admission: RE | Admit: 2023-07-09 | Discharge: 2023-07-09 | Disposition: A | Discharge status: Home / Self Care | Payer: BC Managed Care – PPO | Source: Ambulatory Visit | Attending: Internal Medicine | Admitting: Internal Medicine

## 2023-07-09 DIAGNOSIS — Z1231 Encounter for screening mammogram for malignant neoplasm of breast: Secondary | ICD-10-CM

## 2023-08-26 ENCOUNTER — Emergency Department (HOSPITAL_BASED_OUTPATIENT_CLINIC_OR_DEPARTMENT_OTHER): Payer: BC Managed Care – PPO

## 2023-08-26 ENCOUNTER — Other Ambulatory Visit: Payer: Self-pay

## 2023-08-26 ENCOUNTER — Emergency Department (HOSPITAL_BASED_OUTPATIENT_CLINIC_OR_DEPARTMENT_OTHER)
Admission: EM | Admit: 2023-08-26 | Discharge: 2023-08-26 | Disposition: A | Discharge status: Home / Self Care | Payer: BC Managed Care – PPO | Attending: Emergency Medicine | Admitting: Emergency Medicine

## 2023-08-26 ENCOUNTER — Encounter (HOSPITAL_BASED_OUTPATIENT_CLINIC_OR_DEPARTMENT_OTHER): Payer: Self-pay | Admitting: Emergency Medicine

## 2023-08-26 DIAGNOSIS — R0789 Other chest pain: Secondary | ICD-10-CM

## 2023-08-26 DIAGNOSIS — I10 Essential (primary) hypertension: Secondary | ICD-10-CM | POA: Insufficient documentation

## 2023-08-26 DIAGNOSIS — R072 Precordial pain: Secondary | ICD-10-CM | POA: Diagnosis present

## 2023-08-26 DIAGNOSIS — Z79899 Other long term (current) drug therapy: Secondary | ICD-10-CM | POA: Diagnosis not present

## 2023-08-26 DIAGNOSIS — E876 Hypokalemia: Secondary | ICD-10-CM | POA: Insufficient documentation

## 2023-08-26 LAB — BASIC METABOLIC PANEL
Anion gap: 15 (ref 5–15)
BUN: 11 mg/dL (ref 6–20)
CO2: 29 mmol/L (ref 22–32)
Calcium: 9.4 mg/dL (ref 8.9–10.3)
Chloride: 95 mmol/L — ABNORMAL LOW (ref 98–111)
Creatinine, Ser: 1.07 mg/dL — ABNORMAL HIGH (ref 0.44–1.00)
GFR, Estimated: >60 mL/min (ref >60)
Glucose, Bld: 114 mg/dL — ABNORMAL HIGH (ref 70–99)
Potassium: 2.8 mmol/L — ABNORMAL LOW (ref 3.5–5.1)
Sodium: 139 mmol/L (ref 135–145)

## 2023-08-26 LAB — TROPONIN I (HIGH SENSITIVITY)
Troponin I (High Sensitivity): <2 ng/L (ref <18)
Troponin I (High Sensitivity): <2 ng/L (ref <18)

## 2023-08-26 LAB — CBC
HCT: 39.1 % (ref 36.0–46.0)
Hemoglobin: 12.7 g/dL (ref 12.0–15.0)
MCH: 28.4 pg (ref 26.0–34.0)
MCHC: 32.5 g/dL (ref 30.0–36.0)
MCV: 87.5 fL (ref 80.0–100.0)
Platelets: 241 10*3/uL (ref 150–400)
RBC: 4.47 MIL/uL (ref 3.87–5.11)
RDW: 15 % (ref 11.5–15.5)
WBC: 8.3 10*3/uL (ref 4.0–10.5)
nRBC: 0 % (ref 0.0–0.2)

## 2023-08-26 MED ORDER — POTASSIUM CHLORIDE CRYS ER 20 MEQ PO TBCR
40.0000 meq | EXTENDED_RELEASE_TABLET | Freq: Once | ORAL | Status: AC
  Administered 2023-08-26: 40 meq via ORAL
  Filled 2023-08-26: qty 2

## 2023-08-26 NOTE — Discharge Instructions (Signed)
As we discussed, your lab work including your heart enzyme tests are normal today  I recommend you follow-up with cardiology to get a stress test  Return to ER if you have worse chest pain or shortness of breath

## 2023-08-26 NOTE — ED Triage Notes (Signed)
Pt ambulatory to triage with c/o midsternal nonradiating chest pain that started this morning.  Pt states pain subsided then returned later at work.  Denies associated symptoms.

## 2023-08-26 NOTE — ED Provider Notes (Addendum)
[name] Provider Note   CSN: [id number] Arrival date & time: 08/26/23  1212     History  Chief Complaint  Patient presents with   Chest Pain    [name] is a 57 y.o. female.  57 y.o. female presenting with acute onset substernal chest pain that began shortly after she woke up this morning.  Chest pain described as substernal and pressure.  It resolved after an hour or 2 and she continued on to work.  Later when she was exerting herself the chest pain returned and felt similar to prior in the morning.  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  Past medical history significant for hypertension, hyperlipidemia, hyperthyroidism.  She has been compliant with her blood pressure medicines and reports taking her medication this morning.  She denies nausea or vomiting.  Family history significant for her mother having congestive heart failure in her father dying from heart attack at age 11.  She denies ever having cardiac problems before.  She denies increased pain with deep inspiration, denies palpitations.  Her thyroid was last checked in July and she reports her values were stable at the time.  She has not had any recent travel, denies unilateral swelling or pain.  The history is provided by the patient. No language interpreter was used.  Chest Pain Pain location:  Substernal area Pain quality: pressure   Pain radiates to:  Does not radiate Pain severity:  Moderate Chronicity:  New Associated symptoms: no abdominal pain, no anxiety, no back pain, no cough, no dizziness, no fatigue, no fever, no headache, no lower extremity edema, no nausea, no shortness of breath, no syncope and no vomiting        Home Medications Prior to Admission medications   Medication Sig Start Date End Date Taking? Authorizing Provider  cetirizine (ZYRTEC) 10 MG tablet  Take 10 mg by mouth daily.    [provider]  hydrochlorothiazide (HYDRODIURIL) 12.5 MG tablet Take 12.5 mg by mouth daily.    [provider]  propylthiouracil (PTU) 50 MG tablet Take 50 mg by mouth daily.    [provider]  rosuvastatin (CRESTOR) 10 MG tablet Take 10 mg by mouth daily.    [provider]      Allergies    Methimazole    Review of Systems   Review of Systems  Constitutional:  Negative for activity change, appetite change, fatigue and fever.  HENT:  Negative for facial swelling, postnasal drip, sinus pressure and sinus pain.   Respiratory:  Negative for cough and shortness of breath.   Cardiovascular:  Positive for chest pain. Negative for syncope.  Gastrointestinal:  Negative for abdominal pain, nausea and vomiting.  Musculoskeletal:  Negative for back pain.  Neurological:  Negative for dizziness, seizures, light-headedness and headaches.    Physical Exam Updated Vital Signs BP (!) 155/116 (BP Location: Left Arm)   Pulse 71   Temp 98.2 F (36.8 C)   Resp 18   Ht 5\' 3"  (1.6 m)   Wt 86.6 kg   LMP  (LMP Unknown) Comment: AUB  SpO2 100%   BMI 33.83 kg/m  Physical Exam Constitutional:      General: She is not in acute distress.    Appearance: She is well-developed and normal weight. She is not ill-appearing.  HENT:     Head: Normocephalic.  Cardiovascular:     Rate and Rhythm: Normal rate and regular rhythm.     Heart sounds: Normal heart sounds. No murmur heard. Pulmonary:     Effort: Pulmonary effort is normal. No tachypnea or respiratory distress.     Breath sounds: Normal breath sounds.  Chest:     Chest wall: No tenderness or edema.  Abdominal:     General: Bowel sounds are normal.     Palpations: Abdomen is soft.  Musculoskeletal:        General: Normal range of motion.     Right lower leg: No tenderness. No edema.     Left lower leg: No tenderness. No edema.  Skin:    General: Skin is warm and dry.      Capillary Refill: Capillary refill takes less than 2 seconds.  Neurological:     General: No focal deficit present.     Mental Status: She is alert and oriented to person, place, and time.  Psychiatric:        Mood and Affect: Mood normal.        Behavior: Behavior normal.     ED Results / Procedures / Treatments   Labs (all labs ordered are listed, but only abnormal results are displayed) Labs Reviewed  BASIC METABOLIC PANEL - Abnormal; Notable for the following components:      Result Value   Potassium 2.8 (*)    Chloride 95 (*)    Glucose, Bld 114 (*)    Creatinine, Ser 1.07 (*)    All other components within normal limits  CBC  TROPONIN I (HIGH SENSITIVITY)  TROPONIN I (HIGH SENSITIVITY)    EKG EKG Interpretation Date/Time:  Thursday August 26 2023 12:27:50 EDT Ventricular Rate:  62 PR Interval:  156 QRS Duration:  87 QT Interval:  435 QTC Calculation: 442 R Axis:   23  Text Interpretation: Sinus rhythm Probable left atrial enlargement Low voltage, precordial leads Borderline T abnormalities, anterior leads No previous ECGs available Confirmed by Alvira Monday ([id number]) on 08/26/2023 12:43:02 PM  Radiology No results found.  Procedures Procedures    Medications Ordered in ED Medications  potassium chloride SA (KLOR-CON M) CR tablet 40 mEq (40 mEq Oral Given 08/26/23 1418)    ED Course/ Medical Decision Making/ A&P                                 Medical Decision Making 57 y.o. female presenting with new onset substernal chest pressure.  On presentation vital signs significant for hypertension to 155/116.  On exam she is clinically well-appearing with regular rate and rhythm and no murmurs on exam.  EKG upon independent review showed normal sinus rhythm, no axis deviations, no ST changes.  Chest x-ray upon independent review shows no active cardiopulmonary processes with clear costophrenic angles, good inspiratory effort, no consolidations, normal cardiac  silhouette without widened mediastinum.  Will continue ACS workup with troponin, CMP, CBC.  CMP significant for hypokalemia to potassium of 2.8.  She is given 40 mEq K-Lor for replacement.  On reassessment patient is still stable and chest pain is well-controlled.  First troponin resulted less than 2.  Discharge pending result of second follow-up troponin.  With family history and new onset exertional chest pain most likely patient will need referral to outpatient cardiology for further management workup.  Also recommend follow-up with PCP for rechecking electrolytes outpatient.  Referral placed to outpatient cardiology for  follow-up.  Remainder of management per oncoming ED provider.   Amount and/or Complexity of Data Reviewed Labs: ordered. Decision-making details documented in ED Course. Radiology: ordered and independent interpretation performed.  Risk Prescription drug management.       Final Clinical Impression(s) / ED Diagnoses Final diagnoses:  Atypical chest pain    Rx / DC Orders ED Discharge Orders     None         Glendale Chard, DO 08/26/23 1507    Glendale Chard, DO 08/26/23 1524    Glendale Chard, DO 08/26/23 1536    Charlynne Pander, MD 08/26/23 [phone]

## 2023-09-22 ENCOUNTER — Inpatient Hospital Stay: Payer: BC Managed Care – PPO | Attending: Adult Health

## 2023-09-22 ENCOUNTER — Other Ambulatory Visit: Payer: Self-pay

## 2023-09-22 DIAGNOSIS — D509 Iron deficiency anemia, unspecified: Secondary | ICD-10-CM | POA: Diagnosis present

## 2023-09-22 LAB — IRON AND IRON BINDING CAPACITY (CC-WL,HP ONLY)
Iron: 70 ug/dL (ref 28–170)
Saturation Ratios: 22 % (ref 10.4–31.8)
TIBC: 321 ug/dL (ref 250–450)
UIBC: 251 ug/dL (ref 148–442)

## 2023-09-22 LAB — CBC WITH DIFFERENTIAL (CANCER CENTER ONLY)
Abs Immature Granulocytes: 0.02 10*3/uL (ref 0.00–0.07)
Basophils Absolute: 0 10*3/uL (ref 0.0–0.1)
Basophils Relative: 0 %
Eosinophils Absolute: 0.1 10*3/uL (ref 0.0–0.5)
Eosinophils Relative: 1 %
HCT: 35.6 % — ABNORMAL LOW (ref 36.0–46.0)
Hemoglobin: 11.7 g/dL — ABNORMAL LOW (ref 12.0–15.0)
Immature Granulocytes: 0 %
Lymphocytes Relative: 36 %
Lymphs Abs: 3 10*3/uL (ref 0.7–4.0)
MCH: 29.3 pg (ref 26.0–34.0)
MCHC: 32.9 g/dL (ref 30.0–36.0)
MCV: 89.2 fL (ref 80.0–100.0)
Monocytes Absolute: 0.6 10*3/uL (ref 0.1–1.0)
Monocytes Relative: 7 %
Neutro Abs: 4.8 10*3/uL (ref 1.7–7.7)
Neutrophils Relative %: 56 %
Platelet Count: 212 10*3/uL (ref 150–400)
RBC: 3.99 MIL/uL (ref 3.87–5.11)
RDW: 14.7 % (ref 11.5–15.5)
WBC Count: 8.4 10*3/uL (ref 4.0–10.5)
nRBC: 0 % (ref 0.0–0.2)

## 2023-09-23 LAB — FERRITIN: Ferritin: 89 ng/mL (ref 11–307)

## 2023-09-24 ENCOUNTER — Inpatient Hospital Stay: Payer: BC Managed Care – PPO | Attending: Adult Health | Admitting: Adult Health

## 2023-09-24 DIAGNOSIS — D509 Iron deficiency anemia, unspecified: Secondary | ICD-10-CM

## 2023-09-24 NOTE — Assessment & Plan Note (Signed)
Iron Deficiency Anemia Improvement in iron stores and hemoglobin following IV iron (Venofer 300mg ) on July 26, August 2, and August 9. Hemoglobin still slightly low, but improving. No GI bleeding identified on upper endoscopy and colonoscopy in 2023. -Repeat labs in 12 weeks to monitor hemoglobin and iron stores.  Fatigue Despite improvement in anemia, patient reports persistent fatigue. Low-normal vitamin B12 levels previously noted. -Start over-the-counter oral dissolving B12 supplement.  Pancreatic Lesion Identified on CT scan in August 2023. Patient initially expressed interest in second opinion, but currently unsure. -Consider referral for second opinion if patient decides to pursue this.  Hypertension Change in blood pressure medication due to low potassium and concerns about dehydration. -Monitor blood pressure and potassium levels with new medication regimen. -F/u with PCP about current BP regimen.   Follow-up -Phone visit 1-2 days after labs in 12 weeks to review results.

## 2023-09-24 NOTE — Progress Notes (Signed)
[facility] Cancer Follow up:    Kathryn Dills, MD [address] [place] [address]   DIAGNOSIS: Iron deficiency anemia  I connected with [name] on 09/24/23 at  3:45 PM EDT by telephone and verified that I am speaking with the correct person using two identifiers.  I discussed the limitations, risks, security and privacy concerns of performing an evaluation and management service by telephone and the availability of in person appointments.  I also discussed with the patient that there may be a patient responsible charge related to this service. The patient expressed understanding and agreed to proceed.  Patient location: work, in [place] in private location Provider location: [facility] provider office  SUMMARY OF HEMATOLOGIC HISTORY: 03/23/2022: Hemoglobin 11, MCV 84, RDW 14.7, platelets 244, WBC 9.5, creatinine 1.07, normal LFTs, TIBC 430, iron saturation 19%, ferritin 11 04/27/2022: Hemoglobin 10.5, MCV 85 08/31/2022: Hemoglobin 11.6, MCV 89.1, SPEP: No M protein, reticulocyte's: Absolute: 71.2, 1.8%, reticulocyte hemoglobin: 31.9, LDH 191, folate 11.7, B12 306, iron saturation 14%, ferritin 14, erythropoietin 19 12/07/2022: Hemoglobin 12.1, MCV 87.6, iron saturation 15%, ferritin 12 04/05/2023: Hemoglobin 10.9, MCV 85.1, platelets 239  CURRENT THERAPY: Intermittent IV iron  INTERVAL HISTORY:  Discussed the use of AI scribe software for clinical note transcription with the patient, who gave verbal consent to proceed.  The patient, a 57 year old with a history of iron deficiency anemia, recently underwent three rounds of IV iron (Venofer 300mg ) treatments on July 26th, August 2nd, and August 9th. The treatments were well-tolerated with no reported adverse reactions. Despite the treatments, the patient reports persistent fatigue, with some days feeling "physically drained."  In addition to anemia, the patient has a history of hypertension and was recently  hospitalized due to chest pains. During the hospitalization, it was discovered that the patient's potassium levels were significantly low, leading to a change in their blood pressure medication. The patient also reports concerns about kidney function, as they have been advised to increase fluid intake during multiple doctor visits.  The patient underwent a hysterectomy and had an upper endoscopy and colonoscopy in 2023, which revealed no polyps. A CT scan in August 2023 showed an area on the pancreas that raised concerns, but the patient has not yet decided whether to seek a second opinion for further imaging and follow-up.  The patient's recent lab results show an improvement in iron stores (ferritin increased from 9 to 89) and total iron count. However, the hemoglobin level remains slightly low at 11.7.  Patient Active Problem List   Diagnosis Date Noted   Hyperthyroidism 06/22/2019   Migraine without aura and without status migrainosus, not intractable 01/02/2019   S/P hysterectomy 11/07/2015   Constipation 01/09/2015   Other malaise and fatigue 08/05/2014   Iron deficiency anemia 08/05/2014   Allergy 08/05/2014   Preventative health care 08/05/2014   Annual physical exam 12/29/2011   Hemorrhoids 12/30/2010   DERMATITIS DUE TO FOOD TAKEN INTERNALLY 12/05/2010   HYPERCHOLESTEROLEMIA 11/21/2008   Migraine 06/01/2008    is allergic to methimazole.  MEDICAL HISTORY: Past Medical History:  Diagnosis Date   Allergic state 08/05/2014   Allergy    Anal bleeding    Anemia    Blood in stool    Constipation 01/09/2015   Dermatitis due to food taken internally    rast to milk--class 2   Easy bruising    GERD (gastroesophageal reflux disease)    Hemorrhoids    Hypercholesteremia    Migraine  headache    Nasal congestion     SURGICAL HISTORY: Past Surgical History:  Procedure Laterality Date   EYE SURGERY Bilateral    lasik   HEMORRHOID SURGERY  06/19/2012   Dr. Claud Kelp    MYOMECTOMY  04/2009   Dr. Seymour Bars   ROBOTIC ASSISTED TOTAL HYSTERECTOMY WITH SALPINGECTOMY Bilateral 11/07/2015   Procedure: ROBOTIC ASSISTED TOTAL HYSTERECTOMY WITH BILATERAL SALPINGECTOMY;  Surgeon: Maxie Better, MD;  Location: [facility];  Service: Gynecology;  Laterality: Bilateral;    SOCIAL HISTORY: Social History   Socioeconomic History   Marital status: Married    Spouse name: spencer   Number of children: 0   Years of education: Not on file   Highest education level: Not on file  Occupational History   Occupation: Runner, broadcasting/film/video at [employer], in [place]  Tobacco Use   Smoking status: Never   Smokeless tobacco: Never  Substance and Sexual Activity   Alcohol use: Yes    Comment: socially - sometimes   Drug use: No   Sexual activity: Yes    Birth control/protection: I.U.D.    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  Other Topics Concern   Not on file  Social History Narrative   Not on file   Social Determinants of Health   Financial Resource Strain: Low Risk  (06/25/2023)   Received from [facility]   Overall Financial Resource Strain (CARDIA)    Difficulty of Paying Living Expenses: Not very hard  Food Insecurity: No Food Insecurity (06/25/2023)   Received from [facility]   Hunger Vital Sign    Worried About Running Out of Food in the Last Year: Never true    Ran Out of Food in the Last Year: Never true  Transportation Needs: No Transportation Needs (06/25/2023)   Received [facility] - Transportation    Lack of Transportation (Medical): No    Lack of Transportation (Non-Medical): No  Physical Activity: Unknown (06/25/2023)   Received from [facility]   Exercise Vital Sign    Days of Exercise per Week: 0 days    Minutes of Exercise per Session: Not on file  Stress: No Stress Concern Present (06/25/2023)   Received from [facility] - Occupational [facility]  Questionnaire    Feeling of Stress : Only a little  Social Connections: Socially Integrated (06/25/2023)   Received from [facility]   Social Network    How would you rate your social network (family, work, friends)?: Good participation with social networks  Intimate Partner Violence: Not At Risk (06/25/2023)   Received from [facility]   HITS    Over the last 12 months how often did your partner physically hurt you?: 1    Over the last 12 months how often did your partner insult you or talk down to you?: 2    Over the last 12 months how often did your partner threaten you with physical harm?: 1    Over the last 12 months how often did your partner scream or curse at you?: 1    FAMILY HISTORY: Family History  Problem Relation Age of Onset   Stroke Father    Hypertension Father    Cancer Mother 46       breast/bone cancer   Heart disease Mother        CHF   Hypertension Mother    Hyperlipidemia Mother    Heart attack  Mother    Stroke Mother    Diabetes Mother        type 2   COPD Mother    Kidney disease Mother        stage 4   Migraines Sister    Allergies Sister        seasonal   Congestive Heart Failure Maternal Grandmother    Diabetes Maternal Grandmother        type 2   Hypertension Maternal Grandmother     Review of Systems  Constitutional:  Positive for fatigue. Negative for appetite change, chills, fever and unexpected weight change.  HENT:   Negative for hearing loss, lump/mass and trouble swallowing.   Eyes:  Negative for eye problems and icterus.  Respiratory:  Negative for chest tightness, cough and shortness of breath.   Cardiovascular:  Negative for chest pain, leg swelling and palpitations.  Gastrointestinal:  Negative for abdominal distention, abdominal pain, blood in stool, constipation, diarrhea, nausea and vomiting.  Endocrine: Negative for hot flashes.  Genitourinary:  Negative for difficulty urinating.   Musculoskeletal:  Negative for  arthralgias.  Skin:  Negative for itching and rash.  Neurological:  Negative for dizziness, extremity weakness, headaches and numbness.  Hematological:  Negative for adenopathy. Does not bruise/bleed easily.  Psychiatric/Behavioral:  Negative for depression. The patient is not nervous/anxious.       PHYSICAL EXAMINATION Patient sounds well, in no apparent distress.    LABORATORY DATA:  CBC    Component Value Date/Time   WBC 8.4 09/22/2023 1519   WBC 8.3 08/26/2023 1251   RBC 3.99 09/22/2023 1519   HGB 11.7 (L) 09/22/2023 1519   HCT 35.6 (L) 09/22/2023 1519   PLT 212 09/22/2023 1519   MCV 89.2 09/22/2023 1519   MCH 29.3 09/22/2023 1519   MCHC 32.9 09/22/2023 1519   RDW 14.7 09/22/2023 1519   LYMPHSABS 3.0 09/22/2023 1519   MONOABS 0.6 09/22/2023 1519   EOSABS 0.1 09/22/2023 1519   BASOSABS 0.0 09/22/2023 1519    CMP     Component Value Date/Time   NA 139 08/26/2023 1251   K 2.8 (L) 08/26/2023 1251   CL 95 (L) 08/26/2023 1251   CO2 29 08/26/2023 1251   GLUCOSE 114 (H) 08/26/2023 1251   BUN 11 08/26/2023 1251   CREATININE 1.07 (H) 08/26/2023 1251   CALCIUM 9.4 08/26/2023 1251   PROT 7.0 11/09/2016 1607   ALBUMIN 4.2 11/09/2016 1607   AST 19 11/09/2016 1607   ALT 12 11/09/2016 1607   ALKPHOS 65 11/09/2016 1607   BILITOT 0.3 11/09/2016 1607   GFRNONAA >60 08/26/2023 1251   GFRAA >60 11/08/2015 0508         ASSESSMENT and THERAPY PLAN:   Iron deficiency anemia Iron Deficiency Anemia Improvement in iron stores and hemoglobin following IV iron (Venofer 300mg ) on July 26, August 2, and August 9. Hemoglobin still slightly low, but improving. No GI bleeding identified on upper endoscopy and colonoscopy in 2023. -Repeat labs in 12 weeks to monitor hemoglobin and iron stores.  Fatigue Despite improvement in anemia, patient reports persistent fatigue. Low-normal vitamin B12 levels previously noted. -Start over-the-counter oral dissolving B12  supplement.  Pancreatic Lesion Identified on CT scan in August 2023. Patient initially expressed interest in second opinion, but currently unsure. -Consider referral for second opinion if patient decides to pursue this.  Hypertension Change in blood pressure medication due to low potassium and concerns about dehydration. -Monitor blood pressure and potassium levels with new medication  regimen. -F/u with PCP about current BP regimen.   Follow-up -Phone visit 1-2 days after labs in 12 weeks to review results.   The patient was provided an opportunity to ask questions and all were answered. The patient agreed with the plan and demonstrated an understanding of the instructions.   The patient was advised to call back or seek an in-person evaluation if the symptoms worsen or if the condition fails to improve as anticipated.   I provided 15 minutes of non face-to-face telephone visit time during this encounter, and > 50% was spent counseling as documented under my assessment & plan.   Lillard Anes, NP 09/24/23 3:17 PM Medical Oncology and Hematology [facility] [address] [place], [place] [address] Tel. [phone]    Fax. [phone]  *Total Encounter Time as defined by the Centers for Medicare and Medicaid Services includes, in addition to the face-to-face time of a patient visit (documented in the note above) non-face-to-face time: obtaining and reviewing outside history, ordering and reviewing medications, tests or procedures, care coordination (communications with other health care professionals or caregivers) and documentation in the medical record.

## 2023-09-27 ENCOUNTER — Telehealth: Payer: Self-pay | Admitting: Adult Health

## 2023-09-27 NOTE — Telephone Encounter (Signed)
Spoke with patient confirming upcoming appointment  

## 2023-10-08 ENCOUNTER — Other Ambulatory Visit: Payer: Self-pay | Admitting: Nephrology

## 2023-10-08 DIAGNOSIS — N182 Chronic kidney disease, stage 2 (mild): Secondary | ICD-10-CM

## 2023-10-18 ENCOUNTER — Ambulatory Visit
Admission: RE | Admit: 2023-10-18 | Discharge: 2023-10-18 | Disposition: A | Discharge status: Home / Self Care | Payer: BC Managed Care – PPO | Source: Ambulatory Visit | Attending: Nephrology | Admitting: Nephrology

## 2023-10-18 DIAGNOSIS — N182 Chronic kidney disease, stage 2 (mild): Secondary | ICD-10-CM

## 2023-11-22 ENCOUNTER — Encounter: Payer: Self-pay | Admitting: Hematology and Oncology

## 2023-12-20 ENCOUNTER — Inpatient Hospital Stay: Payer: Self-pay | Attending: Adult Health

## 2023-12-20 ENCOUNTER — Telehealth: Payer: Self-pay | Admitting: Hematology and Oncology

## 2023-12-20 DIAGNOSIS — D509 Iron deficiency anemia, unspecified: Secondary | ICD-10-CM | POA: Insufficient documentation

## 2023-12-20 LAB — CBC WITH DIFFERENTIAL (CANCER CENTER ONLY)
Abs Immature Granulocytes: 0.02 10*3/uL (ref 0.00–0.07)
Basophils Absolute: 0 10*3/uL (ref 0.0–0.1)
Basophils Relative: 1 %
Eosinophils Absolute: 0.1 10*3/uL (ref 0.0–0.5)
Eosinophils Relative: 1 %
HCT: 39.1 % (ref 36.0–46.0)
Hemoglobin: 12.7 g/dL (ref 12.0–15.0)
Immature Granulocytes: 0 %
Lymphocytes Relative: 41 %
Lymphs Abs: 2.7 10*3/uL (ref 0.7–4.0)
MCH: 29.2 pg (ref 26.0–34.0)
MCHC: 32.5 g/dL (ref 30.0–36.0)
MCV: 89.9 fL (ref 80.0–100.0)
Monocytes Absolute: 0.5 10*3/uL (ref 0.1–1.0)
Monocytes Relative: 7 %
Neutro Abs: 3.3 10*3/uL (ref 1.7–7.7)
Neutrophils Relative %: 50 %
Platelet Count: 228 10*3/uL (ref 150–400)
RBC: 4.35 MIL/uL (ref 3.87–5.11)
RDW: 12.9 % (ref 11.5–15.5)
WBC Count: 6.6 10*3/uL (ref 4.0–10.5)
nRBC: 0 % (ref 0.0–0.2)

## 2023-12-20 LAB — IRON AND IRON BINDING CAPACITY (CC-WL,HP ONLY)
Iron: 92 ug/dL (ref 28–170)
Saturation Ratios: 30 % (ref 10.4–31.8)
TIBC: 312 ug/dL (ref 250–450)
UIBC: 220 ug/dL (ref 148–442)

## 2023-12-20 LAB — FERRITIN: Ferritin: 117 ng/mL (ref 11–307)

## 2023-12-20 NOTE — Telephone Encounter (Signed)
Patient called and wanted to cancel apptointment. Unable to find a closer day; patient decided to keep appt.

## 2023-12-27 ENCOUNTER — Inpatient Hospital Stay: Payer: Self-pay | Attending: Adult Health | Admitting: Adult Health

## 2023-12-27 ENCOUNTER — Encounter: Payer: Self-pay | Admitting: Adult Health

## 2023-12-27 DIAGNOSIS — K8689 Other specified diseases of pancreas: Secondary | ICD-10-CM

## 2023-12-27 DIAGNOSIS — D509 Iron deficiency anemia, unspecified: Secondary | ICD-10-CM

## 2023-12-27 NOTE — Assessment & Plan Note (Signed)
Iron deficiency anemia Most recent iron infusion in 05/2023.  Iron levels currently within normal range. No current symptoms of anemia reported. No recent blood in stool or black tarry stool. -Repeat labs in six months to monitor iron levels and blood counts.  Abdominal Mass Previous CT scan in 2023 showed a nodular focus on the pancreas. Patient reports confusion and lack of follow-up from previous GI specialist. No current symptoms reported. -Refer to a new GI specialist within [place] for further evaluation and management.  Chest Pain Patient reports previous consultation with a cardiologist due to chest pain. Mention of possible elevated blood sugar levels. -Advise patient to follow up with cardiologist for further evaluation and management of chest pain and potential blood sugar issues.  Followup Plans -Return in six months for labs and review of labs virtually. -Contact office sooner if symptoms of anemia worsen.

## 2023-12-27 NOTE — Progress Notes (Signed)
[facility] Cancer Follow up:    Kathryn Dills, MD [address] [place] [address]   DIAGNOSIS: Iron deficiency anemia  SUMMARY OF HEMATOLOGIC HISTORY: 03/23/2022: Hemoglobin 11, MCV 84, RDW 14.7, platelets 244, WBC 9.5, creatinine 1.07, normal LFTs, TIBC 430, iron saturation 19%, ferritin 11; 04/27/2022: Hemoglobin 10.5, MCV 85; 08/31/2022: Hemoglobin 11.6, MCV 89.1, SPEP: No M protein, reticulocyte's: Absolute: 71.2, 1.8%, reticulocyte hemoglobin: 31.9, LDH 191, folate 11.7, B12 306, iron saturation 14%, ferritin 14, erythropoietin 19; 12/07/2022: Hemoglobin 12.1, MCV 87.6, iron saturation 15%, ferritin 12; 04/05/2023: Hemoglobin 10.9, MCV 85.1, platelets 239  2. IV Venofer 300mg  x 3 beginning 06/18/2023  CURRENT THERAPY: intermittent IV iron  INTERVAL HISTORY:  Discussed the use of AI scribe software for clinical note transcription with the patient, who gave verbal consent to proceed.  [name] 58 y.o. female a patient with a history of anemia and a previously identified stomach mass, presents with questions about her recent lab results. She reports that her iron levels are good and overall, she believes her lab results look great. However, she has concerns about her NRBC and Basophil Absolute levels, which are close to the normal mark. She denies any symptoms of anemia such as blood in stool or black tarry stool.  In addition to her anemia, she also discusses a previous CT scan that showed a mass in her stomach. She reports confusion and concern about this finding as her previous doctor denied telling her about the mass despite it being documented. She has not had any follow-up imaging or further investigation into this mass.   Patient Active Problem List   Diagnosis Date Noted   Hyperthyroidism 06/22/2019   Migraine without aura and without status migrainosus, not intractable 01/02/2019   S/P hysterectomy 11/07/2015   Constipation 01/09/2015   Other  malaise and fatigue 08/05/2014   Iron deficiency anemia 08/05/2014   Allergy 08/05/2014   Preventative health care 08/05/2014   Annual physical exam 12/29/2011   Hemorrhoids 12/30/2010   DERMATITIS DUE TO FOOD TAKEN INTERNALLY 12/05/2010   HYPERCHOLESTEROLEMIA 11/21/2008   Migraine 06/01/2008    is allergic to methimazole.  MEDICAL HISTORY: Past Medical History:  Diagnosis Date   Allergic state 08/05/2014   Allergy    Anal bleeding    Anemia    Blood in stool    Constipation 01/09/2015   Dermatitis due to food taken internally    rast to milk--class 2   Easy bruising    GERD (gastroesophageal reflux disease)    Hemorrhoids    Hypercholesteremia    Migraine headache    Nasal congestion     SURGICAL HISTORY: Past Surgical History:  Procedure Laterality Date   EYE SURGERY Bilateral    lasik   HEMORRHOID SURGERY  06/19/2012   Dr. Claud Kelp   MYOMECTOMY  04/2009   Dr. Seymour Bars   ROBOTIC ASSISTED TOTAL HYSTERECTOMY WITH SALPINGECTOMY Bilateral 11/07/2015   Procedure: ROBOTIC ASSISTED TOTAL HYSTERECTOMY WITH BILATERAL SALPINGECTOMY;  Surgeon: Maxie Better, MD;  Location: [facility];  Service: Gynecology;  Laterality: Bilateral;    SOCIAL HISTORY: Social History   Socioeconomic History   Marital status: Married    Spouse name: spencer   Number of children: 0   Years of education: Not on file   Highest education level: Not on file  Occupational History   Occupation: Runner, broadcasting/film/video at [employer], in [place]  Tobacco Use   Smoking status: Never   Smokeless tobacco: Never  Substance and Sexual Activity   Alcohol use: Yes    Comment: socially - sometimes   Drug use: No   Sexual activity: Yes    Birth control/protection: I.U.D.    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  Other Topics Concern   Not on file  Social History Narrative   Not on file   Social Drivers of Health   Financial Resource Strain: Low Risk  (06/25/2023)    Received from [facility]   Overall Financial Resource Strain (CARDIA)    Difficulty of Paying Living Expenses: Not very hard  Food Insecurity: No Food Insecurity (06/25/2023)   Received from [facility]   Hunger Vital Sign    Worried About Running Out of Food in the Last Year: Never true    Ran Out of Food in the Last Year: Never true  Transportation Needs: No Transportation Needs (06/25/2023)   Received from [facility] - Transportation    Lack of Transportation (Medical): No    Lack of Transportation (Non-Medical): No  Physical Activity: Unknown (06/25/2023)   Received from [facility]   Exercise Vital Sign    Days of Exercise per Week: 0 days    Minutes of Exercise per Session: Not on file  Stress: No Stress Concern Present (06/25/2023)   Received from [facility] - Occupational Stress Questionnaire    Feeling of Stress : Only a little  Social Connections: Socially Integrated (06/25/2023)   Received from [facility]   Social Network    How would you rate your social network (family, work, friends)?: Good participation with social networks  Intimate Partner Violence: Not At Risk (06/25/2023)   Received from [facility]   HITS    Over the last 12 months how often did your partner physically hurt you?: Never    Over the last 12 months how often did your partner insult you or talk down to you?: Rarely    Over the last 12 months how often did your partner threaten you with physical harm?: Never    Over the last 12 months how often did your partner scream or curse at you?: Never    FAMILY HISTORY: Family History  Problem Relation Age of Onset   Stroke Father    Hypertension Father    Cancer Mother 89       breast/bone cancer   Heart disease Mother        CHF   Hypertension Mother    Hyperlipidemia Mother    Heart attack Mother    Stroke Mother    Diabetes Mother        type 2   COPD Mother    Kidney disease  Mother        stage 4   Migraines Sister    Allergies Sister        seasonal   Congestive Heart Failure Maternal Grandmother    Diabetes Maternal Grandmother        type 2   Hypertension Maternal Grandmother     Review of Systems  Constitutional:  Negative for appetite change, chills, fatigue, fever and unexpected weight change.  HENT:   Negative for hearing loss, lump/mass and trouble swallowing.   Eyes:  Negative for eye problems and icterus.  Respiratory:  Negative for chest tightness, cough and shortness of breath.   Cardiovascular:  Negative for chest pain, leg swelling and palpitations.  Gastrointestinal:  Negative  for abdominal distention, abdominal pain, constipation, diarrhea, nausea and vomiting.  Endocrine: Negative for hot flashes.  Genitourinary:  Negative for difficulty urinating.   Musculoskeletal:  Negative for arthralgias.  Skin:  Negative for itching and rash.  Neurological:  Negative for dizziness, extremity weakness, headaches and numbness.  Hematological:  Negative for adenopathy. Does not bruise/bleed easily.  Psychiatric/Behavioral:  Negative for depression. The patient is not nervous/anxious.       PHYSICAL EXAMINATION Patient sounds well and is in no apparent distress.  Mood and behavior are normal.  Speech is normal.     LABORATORY DATA:  CBC    Component Value Date/Time   WBC 6.6 12/20/2023 0738   WBC 8.3 08/26/2023 1251   RBC 4.35 12/20/2023 0738   HGB 12.7 12/20/2023 0738   HCT 39.1 12/20/2023 0738   PLT 228 12/20/2023 0738   MCV 89.9 12/20/2023 0738   MCH 29.2 12/20/2023 0738   MCHC 32.5 12/20/2023 0738   RDW 12.9 12/20/2023 0738   LYMPHSABS 2.7 12/20/2023 0738   MONOABS 0.5 12/20/2023 0738   EOSABS 0.1 12/20/2023 0738   BASOSABS 0.0 12/20/2023 0738       ASSESSMENT and THERAPY PLAN:   Iron deficiency anemia Iron deficiency anemia Most recent iron infusion in 05/2023.  Iron levels currently within normal range. No current  symptoms of anemia reported. No recent blood in stool or black tarry stool. -Repeat labs in six months to monitor iron levels and blood counts.  Abdominal Mass Previous CT scan in 2023 showed a nodular focus on the pancreas. Patient reports confusion and lack of follow-up from previous GI specialist. No current symptoms reported. -Refer to a new GI specialist within [facility] for further evaluation and management.  Chest Pain Patient reports previous consultation with a cardiologist due to chest pain. Mention of possible elevated blood sugar levels. -Advise patient to follow up with cardiologist for further evaluation and management of chest pain and potential blood sugar issues.  Followup Plans -Return in six months for labs and review of labs virtually. -Contact office sooner if symptoms of anemia worsen.  The patient was provided an opportunity to ask questions and all were answered. The patient agreed with the plan and demonstrated an understanding of the instructions.   The patient was advised to call back or seek an in-person evaluation if the symptoms worsen or if the condition fails to improve as anticipated.   I provided 10 minutes of non face-to-face telephone visit time during this encounter, and > 50% was spent counseling as documented under my assessment & plan.   Lillard Anes, NP 12/27/23 8:58 AM Medical Oncology and Hematology [facility] [address] [place], [place] [address] Tel. [phone]    Fax. [phone]  *Total Encounter Time as defined by the Centers for Medicare and Medicaid Services includes, in addition to the face-to-face time of a patient visit (documented in the note above) non-face-to-face time: obtaining and reviewing outside history, ordering and reviewing medications, tests or procedures, care coordination (communications with other health care professionals or caregivers) and documentation in the medical record.

## 2024-01-21 ENCOUNTER — Telehealth: Payer: Self-pay | Admitting: Gastroenterology

## 2024-01-21 NOTE — Telephone Encounter (Signed)
 Good afternoon Dr. Meridee Score,   We received a referral from patient for pancreatic mass. Patient states she was last seen with Dr. Loreta Ave in August of 2023. Patient states when she was first seen she had a pancreatic mass then when she followed up they stated she does not have a pancreatic mass. Patient would like a second opinion. Please review and advise on scheduling.   Thank you

## 2024-01-21 NOTE — Telephone Encounter (Signed)
 Has this patient had any new imaging since 2023? If she has we need to have that uploaded and reviewed. Otherwise, clinic visit new patient with me to discuss things and review old imaging, can be pursued. Thanks. GM

## 2024-02-02 NOTE — Telephone Encounter (Signed)
 Patient called back and stated that she would only like to schedule with Dr. Meridee Score and she will wait for his June schedule to come out. Please advise.

## 2024-02-02 NOTE — Telephone Encounter (Signed)
 No new testing done. Called Ms Gancarz to attempt scheduling an appointment with one of the [facility]  in [place]. No answer, left a voicemail to call back.

## 2024-04-05 ENCOUNTER — Other Ambulatory Visit: Payer: Self-pay

## 2024-04-05 ENCOUNTER — Encounter (HOSPITAL_BASED_OUTPATIENT_CLINIC_OR_DEPARTMENT_OTHER): Payer: Self-pay | Admitting: Emergency Medicine

## 2024-04-05 ENCOUNTER — Encounter: Payer: Self-pay | Admitting: Hematology and Oncology

## 2024-04-05 ENCOUNTER — Emergency Department (HOSPITAL_BASED_OUTPATIENT_CLINIC_OR_DEPARTMENT_OTHER)
Admission: EM | Admit: 2024-04-05 | Discharge: 2024-04-05 | Disposition: A | Discharge status: Home / Self Care | Attending: Emergency Medicine | Admitting: Emergency Medicine

## 2024-04-05 DIAGNOSIS — Z79899 Other long term (current) drug therapy: Secondary | ICD-10-CM | POA: Insufficient documentation

## 2024-04-05 DIAGNOSIS — M7989 Other specified soft tissue disorders: Secondary | ICD-10-CM | POA: Diagnosis not present

## 2024-04-05 DIAGNOSIS — R600 Localized edema: Secondary | ICD-10-CM | POA: Diagnosis not present

## 2024-04-05 DIAGNOSIS — I10 Essential (primary) hypertension: Secondary | ICD-10-CM | POA: Insufficient documentation

## 2024-04-05 DIAGNOSIS — R519 Headache, unspecified: Secondary | ICD-10-CM | POA: Diagnosis present

## 2024-04-05 LAB — CBC WITH DIFFERENTIAL/PLATELET
Abs Immature Granulocytes: 0.01 10*3/uL (ref 0.00–0.07)
Basophils Absolute: 0.1 10*3/uL (ref 0.0–0.1)
Basophils Relative: 1 %
Eosinophils Absolute: 0.1 10*3/uL (ref 0.0–0.5)
Eosinophils Relative: 2 %
HCT: 36.4 % (ref 36.0–46.0)
Hemoglobin: 11.9 g/dL — ABNORMAL LOW (ref 12.0–15.0)
Immature Granulocytes: 0 %
Lymphocytes Relative: 37 %
Lymphs Abs: 2.7 10*3/uL (ref 0.7–4.0)
MCH: 29.8 pg (ref 26.0–34.0)
MCHC: 32.7 g/dL (ref 30.0–36.0)
MCV: 91.2 fL (ref 80.0–100.0)
Monocytes Absolute: 0.7 10*3/uL (ref 0.1–1.0)
Monocytes Relative: 9 %
Neutro Abs: 3.9 10*3/uL (ref 1.7–7.7)
Neutrophils Relative %: 51 %
Platelets: 231 10*3/uL (ref 150–400)
RBC: 3.99 MIL/uL (ref 3.87–5.11)
RDW: 13.2 % (ref 11.5–15.5)
WBC: 7.5 10*3/uL (ref 4.0–10.5)
nRBC: 0 % (ref 0.0–0.2)

## 2024-04-05 LAB — BASIC METABOLIC PANEL WITH GFR
Anion gap: 10 (ref 5–15)
BUN: 11 mg/dL (ref 6–20)
CO2: 25 mmol/L (ref 22–32)
Calcium: 9.1 mg/dL (ref 8.9–10.3)
Chloride: 106 mmol/L (ref 98–111)
Creatinine, Ser: 0.88 mg/dL (ref 0.44–1.00)
GFR, Estimated: >60 mL/min (ref >60)
Glucose, Bld: 98 mg/dL (ref 70–99)
Potassium: 3.6 mmol/L (ref 3.5–5.1)
Sodium: 141 mmol/L (ref 135–145)

## 2024-04-05 MED ORDER — LOSARTAN POTASSIUM 50 MG PO TABS: 50.0000 mg | ORAL_TABLET | Freq: Every day | ORAL | 0 refills | Status: AC

## 2024-04-05 NOTE — ED Triage Notes (Signed)
 Pt POV steady gait- c/o elevated BP since yesterday, reports compliance with BP meds. Denies CP, ShOB.   C/o headache x1 day. Reports taking prescribed migraine meds yesterday, relief some this AM.

## 2024-04-05 NOTE — Discharge Instructions (Signed)
 We evaluated you for your headache and high blood pressure.  Your examination and testing in the emergency department was reassuring.  I would recommend increasing your blood pressure medication to 50 mg daily.  Please keep a log of your blood pressures.  Check your blood pressure at the same time every day in the sitting position.  Please also discuss this with your primary care doctor.  High blood pressure is usually dangerous over a long time, rather than dangerous over a short time.  If you have any new or worsening symptoms such as chest pain, difficulty breathing, abdominal pain, vomiting, severe headaches, vision changes, or any other concerning symptoms can please return to the emergency department.

## 2024-04-05 NOTE — ED Provider Notes (Signed)
 [place] [facility] Provider Note  CSN: [id number] Arrival date & time: 04/05/24 1201  Chief Complaint(s) Hypertension and Headache  HPI [name] is a 58 y.o. female presenting to the emergency department with high blood pressure.  She reports that she has high blood pressure at baseline.  She takes 25 mg of losartan and has been taking it.  She noticed that yesterday and today she has had elevated blood pressure reading at home, up to 200.  She reports that she has had a headache although this is consistent with her chronic migraine and she has been taking her chronic migraine medication as it is currently improved.  No head trauma.  No numbness or tingling, weakness, visual disturbances.  No neck pain or stiffness.  No abdominal pain or chest pain.  No back pain.  No shortness of breath.  Reports mild chronic leg swelling.  She has primary doctor follow-up next week.   Past Medical History Past Medical History:  Diagnosis Date   Allergic state 08/05/2014   Allergy    Anal bleeding    Anemia    Blood in stool    Constipation 01/09/2015   Dermatitis due to food taken internally    rast to milk--class 2   Easy bruising    GERD (gastroesophageal reflux disease)    Hemorrhoids    Hypercholesteremia    Migraine headache    Nasal congestion    Patient Active Problem List   Diagnosis Date Noted   Hyperthyroidism 06/22/2019   Migraine without aura and without status migrainosus, not intractable 01/02/2019   S/P hysterectomy 11/07/2015   Constipation 01/09/2015   Other malaise and fatigue 08/05/2014   Iron  deficiency anemia 08/05/2014   Allergy 08/05/2014   Preventative health care 08/05/2014   Annual physical exam 12/29/2011   Hemorrhoids 12/30/2010   DERMATITIS DUE TO FOOD TAKEN INTERNALLY 12/05/2010   HYPERCHOLESTEROLEMIA 11/21/2008   Migraine 06/01/2008   Home Medication(s) Prior to Admission medications   Medication Sig Start Date  End Date Taking? Authorizing Provider  cetirizine  (ZYRTEC ) 10 MG tablet Take 10 mg by mouth daily.    [provider]  losartan (COZAAR) 50 MG tablet Take 1 tablet (50 mg total) by mouth daily. 04/05/24   Mordecai Applebaum, MD  propylthiouracil (PTU) 50 MG tablet Take 50 mg by mouth daily.    [provider]  rosuvastatin (CRESTOR) 10 MG tablet Take 10 mg by mouth daily.    [provider]                                                                                                                                    Past Surgical History Past Surgical History:  Procedure Laterality Date   EYE SURGERY Bilateral    lasik   HEMORRHOID SURGERY  06/19/2012   Dr. Boyce Byes   MYOMECTOMY  04/2009   Dr.  Lavoie   ROBOTIC ASSISTED TOTAL HYSTERECTOMY WITH SALPINGECTOMY Bilateral 11/07/2015   Procedure: ROBOTIC ASSISTED TOTAL HYSTERECTOMY WITH BILATERAL SALPINGECTOMY;  Surgeon: Ivery Marking, MD;  Location: [facility];  Service: Gynecology;  Laterality: Bilateral;   Family History Family History  Problem Relation Age of Onset   Stroke Father    Hypertension Father    Cancer Mother 68       breast/bone cancer   Heart disease Mother        CHF   Hypertension Mother    Hyperlipidemia Mother    Heart attack Mother    Stroke Mother    Diabetes Mother        type 2   COPD Mother    Kidney disease Mother        stage 4   Migraines Sister    Allergies Sister        seasonal   Congestive Heart Failure Maternal Grandmother    Diabetes Maternal Grandmother        type 2   Hypertension Maternal Grandmother     Social History Social History   Tobacco Use   Smoking status: Never   Smokeless tobacco: Never  Substance Use Topics   Alcohol use: Yes    Comment: socially - sometimes   Drug use: No   Allergies Methimazole  Review of Systems Review of Systems  All other systems reviewed and are negative.   Physical Exam Vital Signs  I have reviewed  the triage vital signs BP (!) 174/76 (BP Location: Left Arm)   Pulse 60   Temp 98.2 F (36.8 C) (Oral)   Resp 16   Ht 5\' 3"  (1.6 m)   Wt 87.5 kg   LMP  (LMP Unknown) Comment: AUB  SpO2 99%   BMI 34.19 kg/m  Physical Exam Vitals and nursing note reviewed.  Constitutional:      General: She is not in acute distress.    Appearance: She is well-developed.  HENT:     Head: Normocephalic and atraumatic.     Mouth/Throat:     Mouth: Mucous membranes are moist.  Eyes:     Pupils: Pupils are equal, round, and reactive to light.  Cardiovascular:     Rate and Rhythm: Normal rate and regular rhythm.     Heart sounds: No murmur heard. Pulmonary:     Effort: Pulmonary effort is normal. No respiratory distress.     Breath sounds: Normal breath sounds.  Abdominal:     General: Abdomen is flat.     Palpations: Abdomen is soft.     Tenderness: There is no abdominal tenderness.  Musculoskeletal:        General: No tenderness.     Comments: Trace pedal edema  Skin:    General: Skin is warm and dry.  Neurological:     General: No focal deficit present.     Mental Status: She is alert. Mental status is at baseline.     Comments: Cranial nerves II through XII intact, strength 5 out of 5 in the bilateral upper and lower extremities, no sensory deficit to light touch, no dysmetria on finger-nose-finger testing, ambulatory with steady gait.  Psychiatric:        Mood and Affect: Mood normal.        Behavior: Behavior normal.     ED Results and Treatments Labs (all labs ordered are listed, but only abnormal results are displayed) Labs Reviewed  CBC WITH DIFFERENTIAL/PLATELET - Abnormal; Notable for the following  components:      Result Value   Hemoglobin 11.9 (*)    All other components within normal limits  BASIC METABOLIC PANEL WITH GFR                                                                                                                          Radiology No results  found.  Pertinent labs & imaging results that were available during my care of the patient were reviewed by me and considered in my medical decision making (see MDM for details).  Medications Ordered in ED Medications - No data to display                                                                                                                                   Procedures Procedures  (including critical care time)  Medical Decision Making / ED Course   MDM:  58 year old presenting to the emergency department with high blood pressure.  Patient overall well-appearing, neurologic exam is normal.  Physical examination normal.  Suspect likely patient has hypertension in the setting of underlying chronic headache.  Doubt headache is related to any acute intracranial process, patient reports it is similar to her chronic migraine headache, neurologic examination is normal, no history of head trauma.  Very low concern for intracranial lesion, subarachnoid hemorrhage, venous sinus thrombosis, subdural hematoma or other dangerous process.  Patient reports her headache is essentially almost resolved at this point and she declined any further treatment in the emergency department for her headache.  Check some basic labs, given mild headache and some leg swelling although this seems chronic.  No signs of CHF.  No signs of DVT.  If labs are reassuring anticipate discharge.  Can slightly increase her blood pressure medication and instruct her to take log at home for her visit next week with primary physician.    Clinical Course as of 04/05/24 1731  Wed Apr 05, 2024  1729 Laboratory testing overall reassuring.  Recommended following up with primary doctor.  Can increase her losartan from 25-50, recommended keeping a log of blood pressure and follow-up with primary. Will discharge patient to home. All questions answered. Patient comfortable with plan of discharge. Return precautions discussed with  patient and specified on the after visit summary.  [WS]    Clinical Course User Index [WS] Mordecai Applebaum, MD     Additional history obtained: -  Additional history obtained from spouse -External records from outside source obtained and reviewed including: Chart review including previous notes, labs, imaging, consultation notes including prior notes    Lab Tests: -I ordered, reviewed, and interpreted labs.   The pertinent results include:   Labs Reviewed  CBC WITH DIFFERENTIAL/PLATELET - Abnormal; Notable for the following components:      Result Value   Hemoglobin 11.9 (*)    All other components within normal limits  BASIC METABOLIC PANEL WITH GFR    Notable for borderline anemia   Medicines ordered and prescription drug management: Meds ordered this encounter  Medications   losartan (COZAAR) 50 MG tablet    Sig: Take 1 tablet (50 mg total) by mouth daily.    Dispense:  30 tablet    Refill:  0    -I have reviewed the patients home medicines and have made adjustments as needed   Co morbidities that complicate the patient evaluation  Past Medical History:  Diagnosis Date   Allergic state 08/05/2014   Allergy    Anal bleeding    Anemia    Blood in stool    Constipation 01/09/2015   Dermatitis due to food taken internally    rast to milk--class 2   Easy bruising    GERD (gastroesophageal reflux disease)    Hemorrhoids    Hypercholesteremia    Migraine headache    Nasal congestion       Dispostion: Disposition decision including need for hospitalization was considered, and patient discharged from emergency department.    Final Clinical Impression(s) / ED Diagnoses Final diagnoses:  Hypertension, unspecified type     This chart was dictated using voice recognition software.  Despite best efforts to proofread,  errors can occur which can change the documentation meaning.    Mordecai Applebaum, MD 04/05/24 [phone]

## 2024-05-03 ENCOUNTER — Other Ambulatory Visit: Payer: Self-pay | Admitting: Internal Medicine

## 2024-05-03 DIAGNOSIS — Z1231 Encounter for screening mammogram for malignant neoplasm of breast: Secondary | ICD-10-CM

## 2024-06-14 ENCOUNTER — Telehealth: Payer: Self-pay | Admitting: Hematology and Oncology

## 2024-06-14 NOTE — Telephone Encounter (Signed)
 Spoke with patient confirming upcoming appointment

## 2024-06-26 ENCOUNTER — Inpatient Hospital Stay: Payer: Self-pay | Attending: Adult Health

## 2024-06-26 DIAGNOSIS — D509 Iron deficiency anemia, unspecified: Secondary | ICD-10-CM | POA: Diagnosis present

## 2024-06-26 LAB — CBC WITH DIFFERENTIAL (CANCER CENTER ONLY)
Abs Immature Granulocytes: 0.01 K/uL (ref 0.00–0.07)
Basophils Absolute: 0 K/uL (ref 0.0–0.1)
Basophils Relative: 1 %
Eosinophils Absolute: 0.1 K/uL (ref 0.0–0.5)
Eosinophils Relative: 2 %
HCT: 35.3 % — ABNORMAL LOW (ref 36.0–46.0)
Hemoglobin: 11.7 g/dL — ABNORMAL LOW (ref 12.0–15.0)
Immature Granulocytes: 0 %
Lymphocytes Relative: 36 %
Lymphs Abs: 2.4 K/uL (ref 0.7–4.0)
MCH: 28.8 pg (ref 26.0–34.0)
MCHC: 33.1 g/dL (ref 30.0–36.0)
MCV: 86.9 fL (ref 80.0–100.0)
Monocytes Absolute: 0.6 K/uL (ref 0.1–1.0)
Monocytes Relative: 9 %
Neutro Abs: 3.4 K/uL (ref 1.7–7.7)
Neutrophils Relative %: 52 %
Platelet Count: 209 K/uL (ref 150–400)
RBC: 4.06 MIL/uL (ref 3.87–5.11)
RDW: 13.1 % (ref 11.5–15.5)
WBC Count: 6.5 K/uL (ref 4.0–10.5)
nRBC: 0 % (ref 0.0–0.2)

## 2024-06-26 LAB — CMP (CANCER CENTER ONLY)
ALT: 13 U/L (ref 0–44)
AST: 16 U/L (ref 15–41)
Albumin: 4.1 g/dL (ref 3.5–5.0)
Alkaline Phosphatase: 75 U/L (ref 38–126)
Anion gap: 7 (ref 5–15)
BUN: 13 mg/dL (ref 6–20)
CO2: 26 mmol/L (ref 22–32)
Calcium: 9 mg/dL (ref 8.9–10.3)
Chloride: 106 mmol/L (ref 98–111)
Creatinine: 0.99 mg/dL (ref 0.44–1.00)
GFR, Estimated: >60 mL/min (ref >60)
Glucose, Bld: 125 mg/dL — ABNORMAL HIGH (ref 70–99)
Potassium: 3.7 mmol/L (ref 3.5–5.1)
Sodium: 139 mmol/L (ref 135–145)
Total Bilirubin: 0.5 mg/dL (ref 0.0–1.2)
Total Protein: 6.9 g/dL (ref 6.5–8.1)

## 2024-06-26 LAB — IRON AND IRON BINDING CAPACITY (CC-WL,HP ONLY)
Iron: 75 ug/dL (ref 28–170)
Saturation Ratios: 27 % (ref 10.4–31.8)
TIBC: 277 ug/dL (ref 250–450)
UIBC: 202 ug/dL (ref 148–442)

## 2024-06-26 LAB — FERRITIN: Ferritin: 121 ng/mL (ref 11–307)

## 2024-06-28 ENCOUNTER — Inpatient Hospital Stay: Payer: Self-pay | Admitting: Hematology and Oncology

## 2024-06-28 NOTE — Assessment & Plan Note (Signed)
 Lab review: 03/23/2022: Hemoglobin 11, MCV 84, RDW 14.7, platelets 244, WBC 9.5, creatinine 1.07, normal LFTs, TIBC 430, iron  saturation 19%, ferritin 11 04/27/2022: Hemoglobin 10.5, MCV 85 08/31/2022: Hemoglobin 11.6, MCV 89.1, SPEP: No M protein, reticulocyte's: Absolute: 71.2, 1.8%, reticulocyte hemoglobin: 31.9, LDH 191, folate 11.7, B12 306, iron  saturation 14%, ferritin 14, erythropoietin  19 12/07/2022: Hemoglobin 12.1, MCV 87.6, iron  saturation 15%, ferritin 12 04/04/2013: Hemoglobin 10.9, MCV 85.1, platelets 239 06/26/24: Hb 11.7, Iron  sat: 27%, Ferritin 121  IV Iron  05/2023

## 2024-06-29 ENCOUNTER — Inpatient Hospital Stay: Admitting: Hematology and Oncology

## 2024-06-29 DIAGNOSIS — D509 Iron deficiency anemia, unspecified: Secondary | ICD-10-CM

## 2024-06-29 NOTE — Progress Notes (Signed)
 HEMATOLOGY-ONCOLOGY TELEPHONE VISIT PROGRESS NOTE  I connected with our patient on 06/29/24 at 10:00 AM EDT by telephone and verified that I am speaking with the correct person using two identifiers.  I discussed the limitations, risks, security and privacy concerns of performing an evaluation and management service by telephone and the availability of in person appointments.  I also discussed with the patient that there may be a patient responsible charge related to this service. The patient expressed understanding and agreed to proceed.   History of Present Illness: Follow-up of severe fatigue to review the results of blood work  History of Present Illness [name] is a 58 year old female who presents with fatigue and concerns about her hemoglobin levels.  She experiences fatigue and dizziness over the past week, with worsening fatigue. Hemoglobin level is 11.7. Iron  studies show iron  saturation at 27% and ferritin at 121. She received iron  infusions last year when ferritin was 9, which improved her levels. She monitors her iron  levels every six months.      REVIEW OF SYSTEMS:   Constitutional: Denies fevers, chills or abnormal weight loss All other systems were reviewed with the patient and are negative. Observations/Objective:     Assessment Plan:  Iron  deficiency anemia Lab review: 03/23/2022: Hemoglobin 11, MCV 84, RDW 14.7, platelets 244, WBC 9.5, creatinine 1.07, normal LFTs, TIBC 430, iron  saturation 19%, ferritin 11 04/27/2022: Hemoglobin 10.5, MCV 85 08/31/2022: Hemoglobin 11.6, MCV 89.1, SPEP: No M protein, reticulocyte's: Absolute: 71.2, 1.8%, reticulocyte hemoglobin: 31.9, LDH 191, folate 11.7, B12 306, iron  saturation 14%, ferritin 14, erythropoietin  19 12/07/2022: Hemoglobin 12.1, MCV 87.6, iron  saturation 15%, ferritin 12 04/04/2013: Hemoglobin 10.9, MCV 85.1, platelets 239 06/26/24: Hb 11.7, Iron  sat: 27%, Ferritin 121  IV Iron  05/2023  Based on the above results I did not  believe that she would benefit from IV iron  at this time.  I instructed her to check with her primary care physician as to the cause of her fatigue.   Patient will come back to us  on an as-needed basis.   I discussed the assessment and treatment plan with the patient. The patient was provided an opportunity to ask questions and all were answered. The patient agreed with the plan and demonstrated an understanding of the instructions. The patient was advised to call back or seek an in-person evaluation if the symptoms worsen or if the condition fails to improve as anticipated.   I provided 20 minutes of non-face-to-face time during this encounter.  This includes time for charting and coordination of care   Naomi MARLA Chad, MD

## 2024-07-10 ENCOUNTER — Encounter: Payer: Self-pay | Admitting: Internal Medicine

## 2024-07-10 ENCOUNTER — Inpatient Hospital Stay
Admission: RE | Admit: 2024-07-10 | Discharge: 2024-07-10 | Discharge status: Home / Self Care | Source: Ambulatory Visit | Attending: Internal Medicine | Admitting: Internal Medicine

## 2024-07-10 DIAGNOSIS — Z1231 Encounter for screening mammogram for malignant neoplasm of breast: Secondary | ICD-10-CM

## 2024-08-24 NOTE — Progress Notes (Signed)
 Patient ID:  [name] is a 58 y.o. (DOB 06-14-1966) female.  Assessment and Plan   1. Dyslipidemia   2. Need for vaccination   3. Hyperthyroidism   4. Essential hypertension   5. Migraine without aura and without status migrainosus, not intractable   6. Class 1 obesity     [name] was seen today for dyslipidemia, hypertension and hypothyroidism. Diagnoses and all orders for this visit: 1. Dyslipidemia (Primary) Assessment & Plan: Controlled.  Continue healthy food choices.  Take rosuvastatin as prescribed.  2. Need for vaccination Assessment & Plan: Risks and benefits of the Influenza vaccine have been discussed with the patient/care giver.  Patient/care giver concerns were answered to their satisfaction.  Signs and symptoms of adverse effects and when to seek medical attention if adverse effects occur were discussed with the patient/care giver.   Orders: -     Flucelvax Trivalent Preservative Free Prefilled Syringe 3. Hyperthyroidism Assessment & Plan:  Thyroid  panel remains normal.  Continue to keep follow-up appointments with the endocrinologist. 4. Essential hypertension Assessment & Plan: Blood pressure remains very well-controlled.  No known medication side effects.  Continue healthy food choices and exercise as tolerated.  Take all medicines as prescribed.   5. Migraine without aura and without status migrainosus, not intractable Assessment & Plan: Fairly well controlled.  Avoid triggers.    6. Class 1 obesity Assessment & Plan: No significant weight change,  Complicated by dyslipidemia and hypertension.  Exercise as tolerated.    No follow-ups on file.    Patient's Medications       * Accurate as of August 24, 2024  8:13 PM. Reflects encounter med changes as of last refresh          Continued Medications      Instructions  losartan  potassium 50 mg tablet Commonly known as: COZAAR   50 mg, Oral, Daily   propylthiouracil 50 MG  tablet Commonly known as: PTU  Take 1 tablet by mouth once daily.   * rizatriptan 5 MG tablet Commonly known as: MAXALT  5 mg, Once as needed   * rizatriptan 10 MG tablet Commonly known as: MAXALT  10 mg, Oral, As needed, May repeat in 2 hours if needed   rosuvastatin calcium 10 mg tablet Commonly known as: CRESTOR  10 mg, Oral, Daily   SYSTANE COMPLETE 0.6 % Soln Generic drug: Propylene Glycol  APPLY 1 DROP INTO EACH EYE THREE TIMES DAILY   vitamin B-12 1000 mcg tablet Commonly known as: CYANOCOBALAMIN  1,000 mcg, Daily   Vitamin D -3 125 MCG (5000 UT) Tabs  Vitamin D3   ZYRTEC  10 mg chewable tablet Generic drug: cetirizine   ZyrTEC       * * This list has 2 medication(s) that are the same as other medications prescribed for you. Read the directions carefully, and ask your doctor or other care provider to review them with you.           Orders Placed This Encounter  Procedures  . Flucelvax Trivalent Preservative Free Prefilled Syringe    Risks, benefits, and alternatives of the medications and treatment plan prescribed today were discussed, and patient expressed understanding. Plan follow-up as discussed or as needed if any worsening symptoms or change in condition.    A yearly preventative health exam was recommended and current age based recommendations were discussed.  Subjective   Patient ID:  [name] is a 58 y.o. (DOB 09-Dec-1965) female    Patient presents with  .  Dyslipidemia  . Hypertension  . Hypothyroidism     [name] is into the clinic today for follow-up of chronic medical conditions.Past medical history significant for dyslipidemia, hypertension, hypothyroidism, iron  deficiency anemia and migraine headaches.  Dyslipidemia: This is a chronic medical condition.  Patient on rosuvastatin.  Patient's calculated risk of 4.8%.  Hypertension: This is a chronic medical condition.  Patient on losartan .  No chest pain, palpitations, dizziness or  shortness of breath.  Hyperthyroidism: This is a chronic medical condition.  History of Graves' disease.  Patient on propylthiouracil.  Follows up with endocrinologist.  Iron  deficiency anemia: This is a chronic medical condition.  Patient has required treatment with iron  infusion.  Follows up with hematologist.  Migraine headaches: This is a chronic medical condition.  Patient on Maxalt.    Reviewed and updated this visit by provider: Tobacco  Allergies  Meds  Problems  Med Hx  Surg Hx  Fam Hx       Review of Systems  Constitutional:  Negative for activity change, appetite change and chills.  HENT:  Negative for congestion, dental problem and drooling.   Eyes:  Negative for discharge and itching.  Respiratory:  Negative for apnea and chest tightness.   Cardiovascular:  Negative for chest pain.  Genitourinary:  Negative for difficulty urinating.  Psychiatric/Behavioral:  Negative for agitation and behavioral problems.     Objective   Vitals:   08/24/24 1116  BP: 130/82  Patient Position: Sitting  Pulse: 75  Temp: 97.9 F (36.6 C)  TempSrc: Temporal  Resp: 18  Height: 5' 3 (1.6 m)  Weight: 192 lb (87.1 kg)  SpO2: 99%  BMI (Calculated): 34  PainSc:   2  PainLoc: Finger Comment: Right pinky    General: Pleasant woman, no acute distress. HEENT: Normocephalic, atraumatic; Pupils equal, round, reactive to light and accommodation; Conjunctiva normal; Bilateral external auditory canals and tympanic membranes normal; Nares normal; Oropharynx moist and clear Neck:  Supple with normal range of motion; No lymphadenopathy  CV:  S1S2, Regular rate and rhythm, without murmur, gallops or rubs Lungs:  Clear to auscultation bilaterally with normal effort Abdomen:  Bowel sounds active, soft, non-tender, non-distended, No hepatosplenomegaly or masses  Skin:  No focal rashes  Extremities:  No clubbing, cyanosis or edema.  Dorsalis pedis/posterior tibial pulses 2+ Neuro:  No  focal deficits.   *This note was dictated with voice recognition software. Inadvertently, similar sounding words may sometimes get transcribed incorrectly*    *Some images could not be shown.
